# Patient Record
Sex: Male | Born: 1995 | Race: Black or African American | Hispanic: No | Marital: Single | Smoking: Current every day smoker
Health system: Southern US, Community
[De-identification: ages and names within clinical notes are randomized; demographics above are authoritative.]

## PROBLEM LIST (undated history)

## (undated) ENCOUNTER — Ambulatory Visit: Payer: Self-pay | Source: Home / Self Care

---
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
## 2018-06-18 ENCOUNTER — Other Ambulatory Visit: Payer: Self-pay

## 2018-06-18 ENCOUNTER — Encounter (HOSPITAL_COMMUNITY): Payer: Self-pay | Admitting: Emergency Medicine

## 2018-06-18 ENCOUNTER — Emergency Department (HOSPITAL_COMMUNITY)
Admission: EM | Admit: 2018-06-18 | Discharge: 2018-06-18 | Disposition: A | Discharge status: Home / Self Care | Payer: BLUE CROSS/BLUE SHIELD | Attending: Emergency Medicine | Admitting: Emergency Medicine

## 2018-06-18 DIAGNOSIS — M79601 Pain in right arm: Secondary | ICD-10-CM | POA: Insufficient documentation

## 2018-06-18 MED ORDER — METHOCARBAMOL 500 MG PO TABS: 500.0000 mg | ORAL_TABLET | Freq: Two times a day (BID) | ORAL | 0 refills | Status: AC

## 2018-06-18 NOTE — Discharge Instructions (Addendum)
I have prescribed muscle relaxers for your pain, please do not drink or drive while taking this medications as they can make you drowsy.  You may also take aleve over the counter for your pain. Please rest forearm by avoiding  texting or video gamming for the next 5 days.

## 2018-06-18 NOTE — ED Provider Notes (Signed)
[facility]-EMERGENCY DEPT Provider Note   CSN: [id number] Arrival date & time: 06/18/18  1125     History   Chief Complaint Chief Complaint  Patient presents with  . Arm Pain    HPI Mason Brown is a 22 y.o. male.  22 y/o male with no PMH presents to the ED with a chief complaint of right arm pain x 5 days.  Reports he has been playing the game for night several times a day and has been using his hand to play with a remote controls.  He reports mother has tried putting some heat to the area along with taking some Tylenol but states no relief in pain on his muscle.  Scribes the pain is crampy in his forearm but denies any elbow or wrist pain at this time.  It is worse with palpation.  Does currently smoke black in miles but denies any previous history of blood clots.  He denies any shortness of breath, chest pain, trauma.     History reviewed. No pertinent past medical history.  Patient Active Problem List   Diagnosis Date Noted  . PHARYNGITIS 12/24/2009  . GASTROENTERITIS 12/24/2009    History reviewed. No pertinent surgical history.      Home Medications    Prior to Admission medications   Medication Sig Start Date End Date Taking? Authorizing Provider  methocarbamol (ROBAXIN) 500 MG tablet Take 1 tablet (500 mg total) by mouth 2 (two) times daily for 7 days. 06/18/18 06/25/18  Claude Manges, PA-C  naproxen (NAPROSYN) 250 MG tablet Take 1 tablet (250 mg total) by mouth 2 (two) times daily with a meal. 06/20/16   Everlene Farrier, PA-C  oseltamivir (TAMIFLU) 75 MG capsule Take 1 capsule (75 mg total) by mouth every 12 (twelve) hours. 09/11/16   Street, Mullens, PA-C    Family History No family history on file.  Social History Social History   Tobacco Use  . Smoking status: Never Smoker  . Smokeless tobacco: Never Used  Substance Use Topics  . Alcohol use: No  . Drug use: No     Allergies   Patient has no known allergies.   Review  of Systems Review of Systems  Constitutional: Negative for fever.  Musculoskeletal: Positive for myalgias. Negative for arthralgias.     Physical Exam Updated Vital Signs BP 133/83 (BP Location: Left Arm)   Pulse 85   Temp 99.1 F (37.3 C) (Oral)   Resp 20   Ht 5\' 7"  (1.702 m)   Wt 63.5 kg   SpO2 100%   BMI 21.93 kg/m   Physical Exam  Constitutional: He is oriented to person, place, and time. He appears well-developed and well-nourished.  HENT:  Head: Normocephalic and atraumatic.  Mouth/Throat: Oropharynx is clear and moist.  Eyes: Pupils are equal, round, and reactive to light. No scleral icterus.  Neck: Normal range of motion.  Cardiovascular: Normal heart sounds.  Pulmonary/Chest: Effort normal and breath sounds normal. He has no wheezes. He exhibits no tenderness.  Abdominal: Soft. Bowel sounds are normal. He exhibits no distension. There is no tenderness.  Musculoskeletal: He exhibits no deformity.       Right forearm: He exhibits tenderness. He exhibits no swelling, no edema, no deformity and no laceration.       Arms: Neurological: He is alert and oriented to person, place, and time.  Skin: Skin is warm and dry.  Nursing note and vitals reviewed.    ED Treatments / Results  Labs (all labs ordered are listed, but only abnormal results are displayed) Labs Reviewed - No data to display  EKG None  Radiology No results found.  Procedures Procedures (including critical care time)  Medications Ordered in ED Medications - No data to display   Initial Impression / Assessment and Plan / ED Course  I have reviewed the triage vital signs and the nursing notes.  Pertinent labs & imaging results that were available during my care of the patient were reviewed by me and considered in my medical decision making (see chart for details).    Presents with right forearm pain after spending several days playing fortnight.  Mother reports she is tried some heat to the  area but no relieving symptoms.  Upon examination there is no pain on the elbow joint or wrist joint, patient denies any fever.  She has good pulses, will treat this as a musculoskeletal strain due to video gaming.  Patient is to be discharged with muscle relaxers along with rice therapy.  Mother understands and agrees with plan, return precautions provided.  Final Clinical Impressions(s) / ED Diagnoses   Final diagnoses:  Right arm pain    ED Discharge Orders         Ordered    methocarbamol (ROBAXIN) 500 MG tablet  2 times daily     06/18/18 1254           Claude Manges, PA-C 06/18/18 1313    Rolan Bucco, MD 06/18/18 1556

## 2018-06-18 NOTE — ED Notes (Signed)
Bed: WTR8 Expected date: 06/18/18 Expected time: 11:00 AM Means of arrival:  Comments:

## 2018-06-18 NOTE — ED Triage Notes (Signed)
Patient c/o right arm pain x3 days. Denies new injury Reports hx elbow dislocation. Sensation to right arm. Strong radial pulse present.

## 2018-07-09 ENCOUNTER — Emergency Department (HOSPITAL_COMMUNITY)
Admission: EM | Admit: 2018-07-09 | Discharge: 2018-07-09 | Disposition: A | Discharge status: Home / Self Care | Payer: BLUE CROSS/BLUE SHIELD | Attending: Emergency Medicine | Admitting: Emergency Medicine

## 2018-07-09 ENCOUNTER — Emergency Department (HOSPITAL_COMMUNITY): Payer: BLUE CROSS/BLUE SHIELD

## 2018-07-09 ENCOUNTER — Encounter (HOSPITAL_COMMUNITY): Payer: Self-pay | Admitting: Emergency Medicine

## 2018-07-09 DIAGNOSIS — M79631 Pain in right forearm: Secondary | ICD-10-CM | POA: Diagnosis present

## 2018-07-09 DIAGNOSIS — F17290 Nicotine dependence, other tobacco product, uncomplicated: Secondary | ICD-10-CM | POA: Insufficient documentation

## 2018-07-09 MED ORDER — DICLOFENAC SODIUM 50 MG PO TBEC: 50.0000 mg | DELAYED_RELEASE_TABLET | Freq: Two times a day (BID) | ORAL | 0 refills | Status: DC

## 2018-07-09 MED ORDER — IBUPROFEN 200 MG PO TABS
600.0000 mg | ORAL_TABLET | Freq: Once | ORAL | Status: AC
  Administered 2018-07-09: 600 mg via ORAL
  Filled 2018-07-09: qty 3

## 2018-07-09 MED ORDER — METHOCARBAMOL 500 MG PO TABS: 500.0000 mg | ORAL_TABLET | Freq: Two times a day (BID) | ORAL | 0 refills | Status: DC

## 2018-07-09 NOTE — ED Provider Notes (Signed)
[facility]-EMERGENCY DEPT Provider Note   CSN: [id number] Arrival date & time: 07/09/18  1554     History   Chief Complaint Chief Complaint  Patient presents with  . Arm Pain    HPI Mason Brown is a 22 y.o. male right hand dominant who presents to the ED with right forearm pain. Patient was evaluated for the same problem 06/18/18 and treated for muscle spasm that was thought to have occurred due to gaming using remote controls. Patient reports taking the muscle relaxer and less gaming and the pain did improve but 2 days ago it came back worse. Patient denies chest pain shortness of breath, elbow or wrist pain.   HPI  History reviewed. No pertinent past medical history.  Patient Active Problem List   Diagnosis Date Noted  . PHARYNGITIS 12/24/2009  . GASTROENTERITIS 12/24/2009    History reviewed. No pertinent surgical history.      Home Medications    Prior to Admission medications   Medication Sig Start Date End Date Taking? Authorizing Provider  diclofenac (VOLTAREN) 50 MG EC tablet Take 1 tablet (50 mg total) by mouth 2 (two) times daily. 07/09/18   Janne Napoleon, NP  methocarbamol (ROBAXIN) 500 MG tablet Take 1 tablet (500 mg total) by mouth 2 (two) times daily. 07/09/18   Janne Napoleon, NP    Family History No family history on file.  Social History Social History   Tobacco Use  . Smoking status: Current Every Day Smoker    Types: Cigars  . Smokeless tobacco: Never Used  Substance Use Topics  . Alcohol use: No  . Drug use: No     Allergies   Patient has no known allergies.   Review of Systems Review of Systems  Musculoskeletal: Positive for arthralgias.  All other systems reviewed and are negative.    Physical Exam Updated Vital Signs BP 133/61 (BP Location: Right Arm)   Pulse (!) 55   Temp 98.6 F (37 C) (Oral)   Resp 18   SpO2 100%   Physical Exam  Constitutional: He appears well-developed and well-nourished.  No distress.  HENT:  Head: Normocephalic.  Eyes: EOM are normal.  Neck: Neck supple.  Cardiovascular: Normal rate.  Pulmonary/Chest: Effort normal.  Musculoskeletal:       Right forearm: He exhibits tenderness. He exhibits no deformity and no laceration. Swelling: minimal.  Radial pulses 2+, adequate circulation. Grips are equal. Full range of motion wrist and elbow without pain.   Neurological: He is alert.  Skin: Skin is warm and dry.  Psychiatric: He has a normal mood and affect.  Nursing note and vitals reviewed.    ED Treatments / Results  Labs (all labs ordered are listed, but only abnormal results are displayed) Labs Reviewed - No data to display  Radiology Dg Forearm Right  Result Date: 07/09/2018 CLINICAL DATA:  Right forearm pain. EXAM: RIGHT FOREARM - 2 VIEW COMPARISON:  None. FINDINGS: There is no evidence of fracture or other focal bone lesions. Soft tissues are unremarkable. IMPRESSION: Negative. Electronically Signed   By: Lupita Raider, M.D.   On: 07/09/2018 17:34    Procedures Procedures (including critical care time)  Medications Ordered in ED Medications  ibuprofen (ADVIL,MOTRIN) tablet 600 mg (600 mg Oral Given 07/09/18 1805)     Initial Impression / Assessment and Plan / ED Course  I have reviewed the triage vital signs and the nursing notes. 22 y.o. male with right forearm pain  that has been off and on since his last visit to the ED. Patient stable for d/c without focal neuro deficits. Ace wrap, rest the area, muscle relaxer and NSAIDS. Patient to f/u if symptoms worsen.   Final Clinical Impressions(s) / ED Diagnoses   Final diagnoses:  Right forearm pain    ED Discharge Orders         Ordered    diclofenac (VOLTAREN) 50 MG EC tablet  2 times daily     07/09/18 1740    methocarbamol (ROBAXIN) 500 MG tablet  2 times daily     07/09/18 1740           Damian Leavelleese, NicasioHope M, NP 07/09/18 2148    Charlynne PanderYao, David Hsienta, MD 07/09/18 2328

## 2018-07-09 NOTE — Discharge Instructions (Signed)
Do not drive or work while taking the muscle relaxer as it will make you sleepy. Follow up with Dr. Amanda PeaGramig if symptoms persist.

## 2018-07-09 NOTE — ED Triage Notes (Signed)
Pt c/o right forearm pain. Was seen here about 3 weeks ago and took muscle relaxer's but still having pains esp when moving wrist around.

## 2019-05-10 IMAGING — CR DG FOREARM 2V*R*
2 series · 2 of 2 positions shown · non-contrast
Comparison: None.

CLINICAL DATA: Right forearm pain.

EXAM:
RIGHT FOREARM - 2 VIEW

[x forearm ap right]
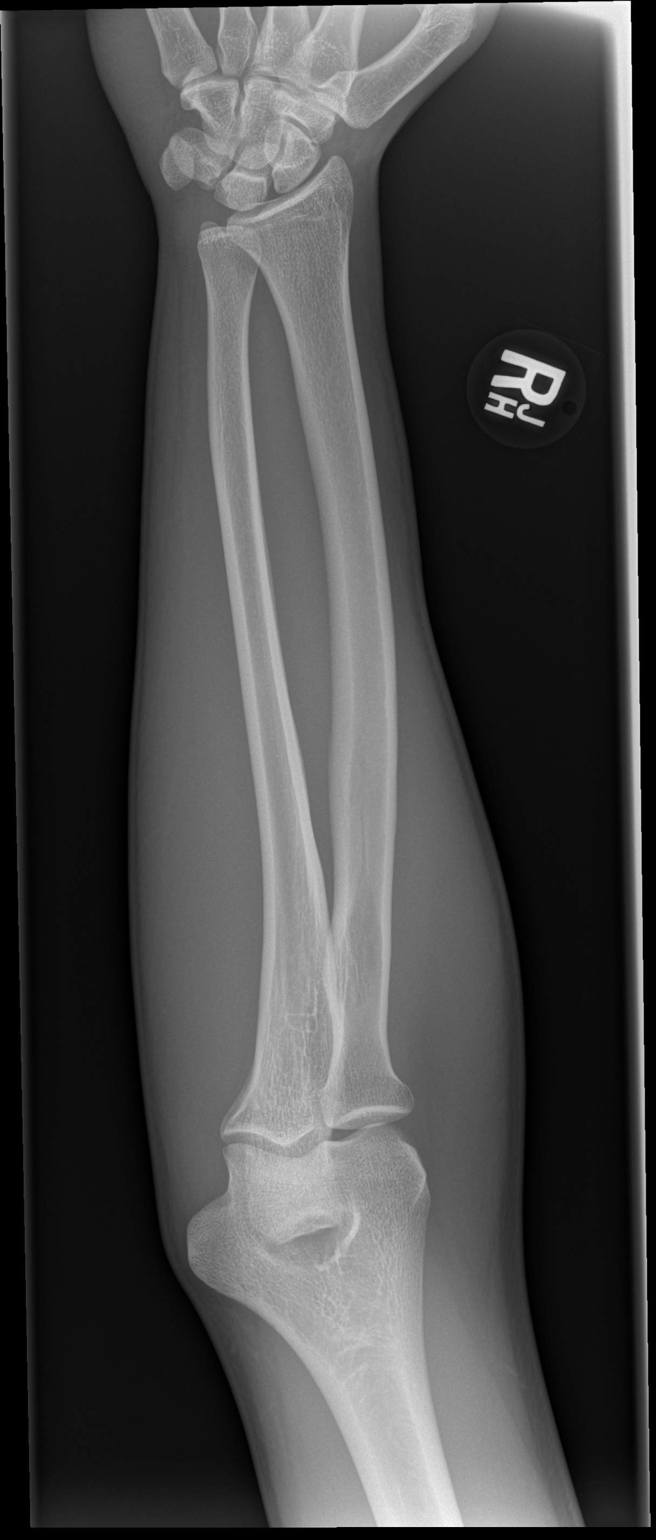

[x forearm lat right]
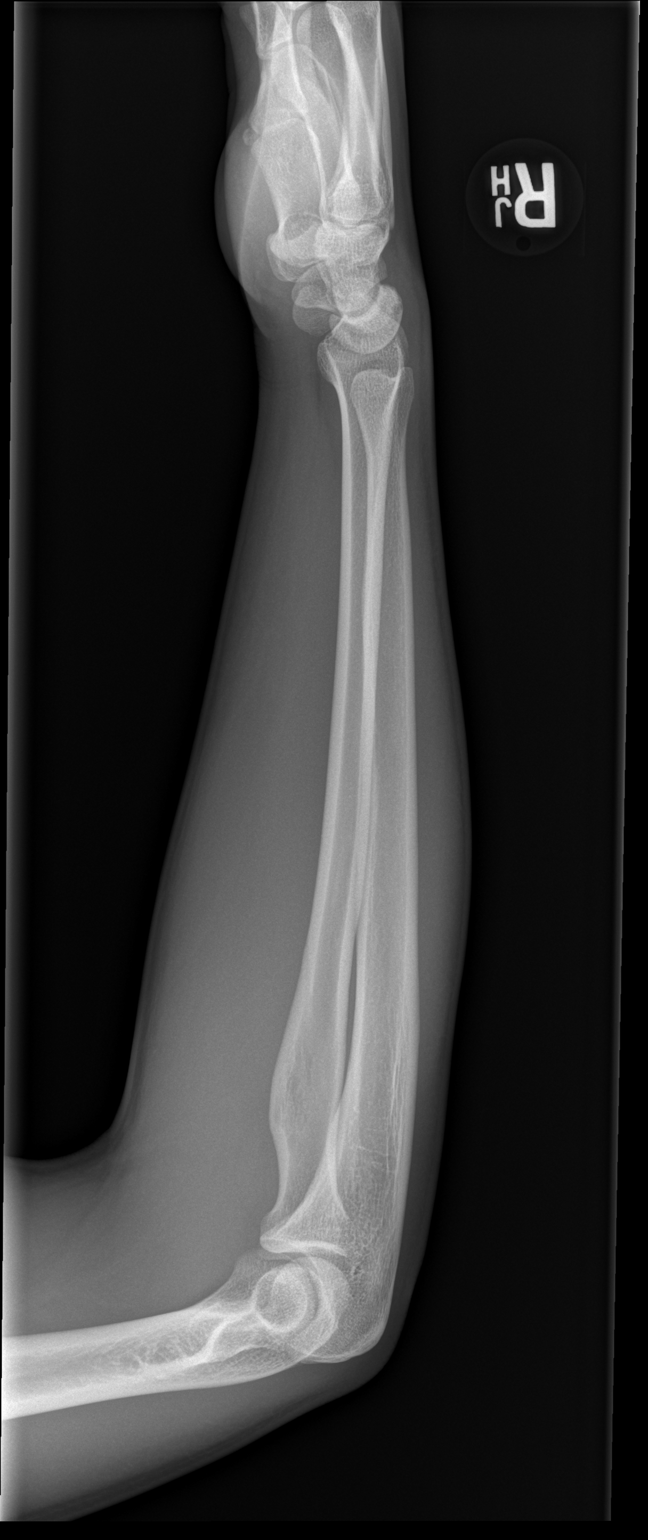

[2 of 2 positions shown; findings below may reference images not displayed]

FINDINGS: There is no evidence of fracture or other focal bone lesions. Soft
tissues are unremarkable.
IMPRESSION: Negative.

## 2019-09-11 ENCOUNTER — Ambulatory Visit: Payer: Self-pay | Attending: Internal Medicine

## 2019-09-11 DIAGNOSIS — U071 COVID-19: Secondary | ICD-10-CM | POA: Insufficient documentation

## 2019-09-11 DIAGNOSIS — Z20822 Contact with and (suspected) exposure to covid-19: Secondary | ICD-10-CM

## 2019-09-12 LAB — NOVEL CORONAVIRUS, NAA: SARS-CoV-2, NAA: DETECTED — AB

## 2019-09-13 ENCOUNTER — Ambulatory Visit (INDEPENDENT_AMBULATORY_CARE_PROVIDER_SITE_OTHER)
Admission: RE | Admit: 2019-09-13 | Discharge: 2019-09-13 | Disposition: A | Discharge status: Home / Self Care | Payer: Self-pay | Source: Ambulatory Visit

## 2019-09-13 DIAGNOSIS — F17290 Nicotine dependence, other tobacco product, uncomplicated: Secondary | ICD-10-CM

## 2019-09-13 DIAGNOSIS — U071 COVID-19: Secondary | ICD-10-CM

## 2019-09-13 DIAGNOSIS — R062 Wheezing: Secondary | ICD-10-CM

## 2019-09-13 MED ORDER — ONDANSETRON HCL 4 MG PO TABS: 4.0000 mg | ORAL_TABLET | Freq: Three times a day (TID) | ORAL | 0 refills | Status: DC | PRN

## 2019-09-13 MED ORDER — ALBUTEROL SULFATE HFA 108 (90 BASE) MCG/ACT IN AERS: 1.0000 | INHALATION_SPRAY | RESPIRATORY_TRACT | 0 refills | Status: DC | PRN

## 2019-09-13 NOTE — ED Provider Notes (Signed)
Virtual Visit via Video Note:  Mason Brown  initiated request for Telemedicine visit with [facility] team. I connected with Mason Brown  on 09/13/2019 at 2:57 PM  for a synchronized telemedicine visit using a video enabled HIPPA compliant telemedicine application. I verified that I am speaking with Mason Brown  using two identifiers. Georgetta Haber, NP  was physically located in a [facility] Urgent care site and Mason Brown was located at a different location.   The limitations of evaluation and management by telemedicine as well as the availability of in-person appointments were discussed. Patient was informed that he  may incur a bill ( including co-pay) for this virtual visit encounter. [name]  expressed understanding and gave verbal consent to proceed with virtual visit.     History of Present Illness:Mason Brown  is a 24 y.o. male presents with complaints of shortness of breath and chest tightness. Woke this morning and felt severe symptoms, his sister called EMS and he was given a breathing treatment. Symptoms resolved. He has covid-19, symptoms started yesterday. Family members in his home also with covid. Now his breathing feels much better, easy and with minimal chest tightness. Still with dry cough. No chest pain . No asthma history. Does smoke 3 black and milds a day and smokes marijuana daily. No fevers. Has had some nausea and vomiting.    History reviewed. No pertinent past medical history.  No Known Allergies      Observations/Objective: Alert, oriented, non toxic in appearance. Clear coherent speech without difficulty. No increased work of breathing visualized.  No cough throughout discussion. No tachypnea noted.  Assessment and Plan: Patient with symptom resolution with use of nebulizer this morning. Known covid. Smoker. No asthma history. Was encouraged by ems to get an inhaler, which is provided today. zofran prn as well. Strict in person  precautions provided. Patient verbalized understanding and agreeable to plan.    Follow Up Instructions:    I discussed the assessment and treatment plan with the patient. The patient was provided an opportunity to ask questions and all were answered. The patient agreed with the plan and demonstrated an understanding of the instructions.   The patient was advised to call back or seek an in-person evaluation if the symptoms worsen or if the condition fails to improve as anticipated.  I provided 15 minutes of non-face-to-face time during this encounter.    Georgetta Haber, NP  09/13/2019 2:57 PM          Georgetta Haber, NP 09/13/19 1459

## 2019-09-13 NOTE — Discharge Instructions (Addendum)
Use of inhaler as needed every 4-6 hours for wheezing or chest tightness.  Zofran every 8 hours as needed for nausea or vomiting.   You may try some vitamins to help your immune system potentially:  Vitamin C 500mg  twice a day. Zinc 50mg  daily. Vitamin D 5000IU daily.   If you develop shortness of breath , difficulty breathing, chest pain, your inhaler is not helping, please be seen in person.  Please try to decrease to quit smoking as this can exacerbate and lengthen course of illness.

## 2019-09-29 ENCOUNTER — Telehealth: Payer: Self-pay | Admitting: Physician Assistant

## 2019-09-29 DIAGNOSIS — U071 COVID-19: Secondary | ICD-10-CM

## 2019-09-29 NOTE — Progress Notes (Signed)
We are sorry you are not feeling well. We are here to help!  You have tested positive for COVID-19, meaning that you were infected with the novel coronavirus and could give the germ to others.  Because you tested positive, you should have gone 14 days from that test which is February 18 accordingly to the date she was given. So it is okay for you to return to work now.  You have been enrolled in [facility] for COVID-19. Daily you will receive a questionnaire within the [facility] website. Our COVID-19 response team will be monitoring your responses daily.  Please continue isolation at home, for at least 10 days since the start of your symptoms and until you have had 24 hours with no fever (without taking a fever reducer) and with improving of symptoms. Please continue good preventive care measures, including: frequent hand-washing, avoid touching your face, cover coughs/sneezes, stay out of crowds and keep a 6 foot distance from others. Follow up with your provider or go to the nearest hospital ED for re-assessment if fever/cough/breathlessness return.  The following symptoms may appear 2-14 days after exposure:  Fever  Cough  Shortness of breath or difficulty breathing  Chills  Repeated shaking with chills  Muscle pain  Headache  Sore throat  New loss of taste or smell  Fatigue  Congestion or runny nose  Nausea or vomiting  Diarrhea Go to the nearest hospital ED for assessment if fever/cough/breathlessness are severe or illness seems like a threat to life. It is vitally important that if you feel that you have an infection such as this virus or any other virus that you stay home and away from places where you may spread it to others. You should avoid contact with people age 45 and older.  You may also take acetaminophen (Tylenol) as needed for fever.  Reduce your risk of any infection by using the same precautions used for avoiding the common cold or flu:  Wash your hands often with  soap and warm water for at least 20 seconds. If soap and water are not readily available, use an alcohol-based hand sanitizer with at least 60% alcohol.  If coughing or sneezing, cover your mouth and nose by coughing or sneezing into the elbow areas of your shirt or coat, into a tissue or into your sleeve (not your hands).  Avoid shaking hands with others and consider head nods or verbal greetings only.  Avoid touching your eyes, nose, or mouth with unwashed hands.  Avoid close contact with people who are sick.  Avoid places or events with large numbers of people in one location, like concerts or sporting events.  Carefully consider travel plans you have or are making.  If you are planning any travel outside or inside the [place], visit the CDC's Travelers' Health webpage for the latest health notices. If you have some symptoms but not all symptoms, continue to monitor at home and seek medical attention if your symptoms worsen.  If you are having a medical emergency, call 911. HOME CARE  Only take medications as instructed by your medical team.  Drink plenty of fluids and get plenty of rest.  A steam or ultrasonic humidifier can help if you have congestion.  GET HELP RIGHT AWAY IF YOU HAVE EMERGENCY WARNING SIGNS** FOR COVID-19. If you or someone is showing any of these signs seek emergency medical care immediately. Call 911 or proceed to your closest emergency facility if:  You develop worsening high fever.  Trouble breathing  Bluish lips or face  Persistent pain or pressure in the chest  New confusion  Inability to wake or stay awake  You cough up blood.  Your symptoms become more severe **This list is not all possible symptoms. Contact your medical provider for any symptoms that are sever or concerning to you.  MAKE SURE YOU  Understand these instructions.  Will watch your condition.  Will get help right away if you are not doing well or get worse. Your e-visit answers were reviewed by a  board certified advanced clinical practitioner to complete your personal care plan. Depending on the condition, your plan could have included both over the counter or prescription medications. If there is a problem please reply once you have received a response from your provider.  Your safety is important to [place]. If you have drug allergies check your prescription carefully.  You can use MyChart to ask questions about today's visit, request a non-urgent call back, or ask for a work or school excuse for 24 hours related to this e-Visit. If it has been greater than 24 hours you will need to follow up with your provider, or enter a new e-Visit to address those concerns.  You will get an e-mail in the next two days asking about your experience. I hope that your e-visit has been valuable and will speed your recovery. Thank you for using e-visits.   Prudy Feeler PA-C

## 2019-09-29 NOTE — Progress Notes (Signed)
We are sorry you are not feeling well. We are here to help!  You have tested positive for COVID-19, meaning that you were infected with the novel coronavirus and could give the germ to others.   Because you tested positive, you should have gone 14 days from that test which is February 18 accordingly to the date she was given.  So it is okay for you to return to work now.     You have been enrolled in [facility] for COVID-19. Daily you will receive a questionnaire within the [facility] website. Our COVID-19 response team will be monitoring your responses daily.  Please continue isolation at home, for at least 10 days since the start of your symptoms and until you have had 24 hours with no fever (without taking a fever reducer) and with improving of symptoms.  Please continue good preventive care measures, including:  frequent hand-washing, avoid touching your face, cover coughs/sneezes, stay out of crowds and keep a 6 foot distance from others.  Follow up with your provider or go to the nearest hospital ED for re-assessment if fever/cough/breathlessness return.  The following symptoms may appear 2-14 days after exposure: . Fever . Cough . Shortness of breath or difficulty breathing . Chills . Repeated shaking with chills . Muscle pain . Headache . Sore throat . New loss of taste or smell . Fatigue . Congestion or runny nose . Nausea or vomiting . Diarrhea  Go to the nearest hospital ED for assessment if fever/cough/breathlessness are severe or illness seems like a threat to life.  It is vitally important that if you feel that you have an infection such as this virus or any other virus that you stay home and away from places where you may spread it to others.  You should avoid contact with people age 30 and older.     You may also take acetaminophen (Tylenol) as needed for fever.  Reduce your risk of any infection by using the same precautions used for avoiding the common cold  or flu:  [place] Kitchen Wash your hands often with soap and warm water for at least 20 seconds.  If soap and water are not readily available, use an alcohol-based hand sanitizer with at least 60% alcohol.  . If coughing or sneezing, cover your mouth and nose by coughing or sneezing into the elbow areas of your shirt or coat, into a tissue or into your sleeve (not your hands). . Avoid shaking hands with others and consider head nods or verbal greetings only. . Avoid touching your eyes, nose, or mouth with unwashed hands.  . Avoid close contact with people who are sick. . Avoid places or events with large numbers of people in one location, like concerts or sporting events. . Carefully consider travel plans you have or are making. . If you are planning any travel outside or inside the [place], visit the CDC's Travelers' Health webpage for the latest health notices. . If you have some symptoms but not all symptoms, continue to monitor at home and seek medical attention if your symptoms worsen. . If you are having a medical emergency, call 911.  HOME CARE . Only take medications as instructed by your medical team. . Drink plenty of fluids and get plenty of rest. . A steam or ultrasonic humidifier can help if you have congestion.   GET HELP RIGHT AWAY IF YOU HAVE EMERGENCY WARNING SIGNS** FOR COVID-19. If you or someone is showing any of these signs seek  emergency medical care immediately. Call 911 or proceed to your closest emergency facility if: . You develop worsening high fever. . Trouble breathing . Bluish lips or face . Persistent pain or pressure in the chest . New confusion . Inability to wake or stay awake . You cough up blood. . Your symptoms become more severe  **This list is not all possible symptoms. Contact your medical provider for any symptoms that are sever or concerning to you.  MAKE SURE YOU   Understand these instructions.  Will watch your condition.  Will get help right away if you  are not doing well or get worse.  Your e-visit answers were reviewed by a board certified advanced clinical practitioner to complete your personal care plan.  Depending on the condition, your plan could have included both over the counter or prescription medications.  If there is a problem please reply once you have received a response from your provider.  Your safety is important to [place].  If you have drug allergies check your prescription carefully.    You can use MyChart to ask questions about today's visit, request a non-urgent call back, or ask for a work or school excuse for 24 hours related to this e-Visit. If it has been greater than 24 hours you will need to follow up with your provider, or enter a new e-Visit to address those concerns. You will get an e-mail in the next two days asking about your experience.  I hope that your e-visit has been valuable and will speed your recovery. Thank you for using e-visits.   Prudy Feeler PA-C  Approximately 5 minutes was spent documenting and reviewing patient's chart.

## 2021-01-07 ENCOUNTER — Other Ambulatory Visit: Payer: Self-pay

## 2021-01-07 ENCOUNTER — Ambulatory Visit
Admission: EM | Admit: 2021-01-07 | Discharge: 2021-01-07 | Disposition: A | Discharge status: Home / Self Care | Payer: Self-pay | Attending: Student | Admitting: Student

## 2021-01-07 DIAGNOSIS — N489 Disorder of penis, unspecified: Secondary | ICD-10-CM | POA: Insufficient documentation

## 2021-01-07 DIAGNOSIS — Z113 Encounter for screening for infections with a predominantly sexual mode of transmission: Secondary | ICD-10-CM | POA: Insufficient documentation

## 2021-01-07 MED ORDER — CEFTRIAXONE SODIUM 500 MG IJ SOLR
500.0000 mg | Freq: Once | INTRAMUSCULAR | Status: AC
  Administered 2021-01-07: 500 mg via INTRAMUSCULAR

## 2021-01-07 NOTE — ED Provider Notes (Signed)
[facility]    CSN: [id number] Arrival date & time: 01/07/21  1728      History   Chief Complaint Chief Complaint  Patient presents with  . Penis Pain    HPI Mason Brown is a 25 y.o. male presenting with penis pain. Medical history noncontributory. Pt went to the beach 3 weeks ago and notes when he showered that night he noticed what appeared to be a scab on the top of his penis near the opening. One week later the lesion became  "very painful" an appeared to look like an "open wound" with a new onset of left sided groin pain. Three days ago, the area worsened, increased in size and transitioned from a red to white color with scabbing. Has been using A&D ointment noting improvement initially, but has since stopped decreasing effectiveness. Denies itching. No n/v/d. No swelling or discharge. No dysuria. Adamant that he is with the same male partner and denies STI risk.   HPI  History reviewed. No pertinent past medical history.  Patient Active Problem List   Diagnosis Date Noted  . PHARYNGITIS 12/24/2009  . GASTROENTERITIS 12/24/2009    History reviewed. No pertinent surgical history.     Home Medications    Prior to Admission medications   Medication Sig Start Date End Date Taking? Authorizing Provider  albuterol (VENTOLIN HFA) 108 (90 Base) MCG/ACT inhaler Inhale 1-2 puffs into the lungs every 4 (four) hours as needed for wheezing or shortness of breath. 09/13/19   Georgetta Haber, NP  diclofenac (VOLTAREN) 50 MG EC tablet Take 1 tablet (50 mg total) by mouth 2 (two) times daily. 07/09/18   Janne Napoleon, NP  methocarbamol (ROBAXIN) 500 MG tablet Take 1 tablet (500 mg total) by mouth 2 (two) times daily. 07/09/18   Janne Napoleon, NP  ondansetron (ZOFRAN) 4 MG tablet Take 1 tablet (4 mg total) by mouth every 8 (eight) hours as needed for nausea or vomiting. 09/13/19   Georgetta Haber, NP    Family History History reviewed. No pertinent family history.  Social  History Social History   Tobacco Use  . Smoking status: Current Every Day Smoker    Types: Cigars  . Smokeless tobacco: Never Used  Vaping Use  . Vaping Use: Former  Substance Use Topics  . Alcohol use: Yes    Comment: occasionally  . Drug use: Yes    Types: Marijuana    Comment: QD     Allergies   Patient has no known allergies.   Review of Systems Review of Systems  Constitutional: Negative for appetite change, chills, diaphoresis and fever.  Respiratory: Negative for shortness of breath.   Cardiovascular: Negative for chest pain.  Gastrointestinal: Negative for abdominal pain, blood in stool, constipation, diarrhea, nausea and vomiting.  Genitourinary: Negative for decreased urine volume, difficulty urinating, dysuria, flank pain, frequency, genital sores, hematuria, penile discharge, penile pain, penile swelling, scrotal swelling, testicular pain and urgency.       Penile lesion  Musculoskeletal: Negative for back pain.  Neurological: Negative for dizziness, weakness and light-headedness.  All other systems reviewed and are negative.    Physical Exam Triage Vital Signs ED Triage Vitals  Enc Vitals Group     BP 01/07/21 1856 118/76     Pulse Rate 01/07/21 1856 72     Resp 01/07/21 1856 18     Temp 01/07/21 1856 98.9 F (37.2 C)     Temp Source 01/07/21 1856 Oral  SpO2 01/07/21 1856 97 %     Weight --      Height --      Head Circumference --      Peak Flow --      Pain Score 01/07/21 1904 5     Pain Loc --      Pain Edu? --      Excl. in GC? --    No data found.  Updated Vital Signs BP 118/76 (BP Location: Right Arm)   Pulse 72   Temp 98.9 F (37.2 C) (Oral)   Resp 18   SpO2 97%   Visual Acuity Right Eye Distance:   Left Eye Distance:   Bilateral Distance:    Right Eye Near:   Left Eye Near:    Bilateral Near:     Physical Exam Vitals reviewed. Exam conducted with a chaperone present.  Constitutional:      General: He is not in acute  distress.    Appearance: Normal appearance. He is not ill-appearing.  HENT:     Head: Normocephalic and atraumatic.     Mouth/Throat:     Mouth: Mucous membranes are moist.     Comments: Moist mucous membranes Eyes:     Extraocular Movements: Extraocular movements intact.     Pupils: Pupils are equal, round, and reactive to light.  Cardiovascular:     Rate and Rhythm: Normal rate and regular rhythm.     Heart sounds: Normal heart sounds.  Pulmonary:     Effort: Pulmonary effort is normal.     Breath sounds: Normal breath sounds. No wheezing, rhonchi or rales.  Abdominal:     General: Bowel sounds are normal. There is no distension.     Palpations: Abdomen is soft. There is no mass.     Tenderness: There is no abdominal tenderness. There is no right CVA tenderness, left CVA tenderness, guarding or rebound.  Genitourinary:    Comments: Chaperone - CNA Shay  there is a 0.5 cm penile lesion on the penile head, mildly tender to palpation. Scabbed over. No surrounding erythema. No discharge. L inguinal lymphadenopathy.   Skin:    General: Skin is warm.     Capillary Refill: Capillary refill takes less than 2 seconds.     Comments: Good skin turgor  Neurological:     General: No focal deficit present.     Mental Status: He is alert and oriented to person, place, and time.  Psychiatric:        Mood and Affect: Mood normal.        Behavior: Behavior normal.      UC Treatments / Results  Labs (all labs ordered are listed, but only abnormal results are displayed) Labs Reviewed  HSV CULTURE AND TYPING  HIV ANTIBODY (ROUTINE TESTING W REFLEX)  RPR  CYTOLOGY, (ORAL, ANAL, URETHRAL) ANCILLARY ONLY    EKG   Radiology No results found.  Procedures Procedures (including critical care time)  Medications Ordered in UC Medications  cefTRIAXone (ROCEPHIN) injection 500 mg (500 mg Intramuscular Given 01/07/21 1946)    Initial Impression / Assessment and Plan / UC Course  I  have reviewed the triage vital signs and the nursing notes.  Pertinent labs & imaging results that were available during my care of the patient were reviewed by me and considered in my medical decision making (see chart for details).     This patient is a 25 year old male presenting with penile lesion.  He is afebrile, nontachycardic nontachypneic.  No abdominal pain or CVAT.  On exam, there is a 0.5 cm penile lesion on the penile head, mildly tender to palpation.  No discharge, erythema.  DDx is chancroid versus chancre versus LGV vs HSV. Rocephin administered today. Will test for gonorrhea, chlamydia, trichomonas, HIV, syphilis, HSV. Safe sex precautions.  Coding this visit a level 4 for new condition with uncertain prognosis (penile lesion), and prescription drug management.  Final Clinical Impressions(s) / UC Diagnoses   Final diagnoses:  Routine screening for STI (sexually transmitted infection)  Penile lesion     Discharge Instructions     -We are screening you for gonorrhea, chlamydia, trichomonas, HIV, syphilis, genital herpes. -We treated you with an antibiotic today to cover for some of these illnesses. -We will call you and send over additional medication if we need to pending test results. -Seek additional medical attention if your symptoms worsen despite treatment, like worsening of the lesion, new fever/chills, worsening of the groin pain, new abdominal pain, new back pain. -Abstain from intercourse until your lesion is healed and we have diagnosed your condition.    ED Prescriptions    None     PDMP not reviewed this encounter.   Rhys Martini, PA-C 01/08/21 1020

## 2021-01-07 NOTE — ED Triage Notes (Signed)
Pt went to the beach 3 weeks ago and notes when he showered that night he noticed what appeared to be a scab on the top of his penis near the opening. One week later the lesion became  "very painful" an appeared to look like an "open wound" with a new onset of left sided groin pain. Three days ago, the area worsened, increased in size and transitioned from a red to white color.  Has been using A&D ointment noting improvement initially, but has since stopped decreasing effectiveness. Denies itching. No n/v/d. No swelling or discharge. No dysuria.

## 2021-01-07 NOTE — Discharge Instructions (Addendum)
-  We are screening you for gonorrhea, chlamydia, trichomonas, HIV, syphilis, genital herpes. -We treated you with an antibiotic today to cover for some of these illnesses. -We will call you and send over additional medication if we need to pending test results. -Seek additional medical attention if your symptoms worsen despite treatment, like worsening of the lesion, new fever/chills, worsening of the groin pain, new abdominal pain, new back pain. -Abstain from intercourse until your lesion is healed and we have diagnosed your condition.

## 2021-01-10 LAB — CYTOLOGY, (ORAL, ANAL, URETHRAL) ANCILLARY ONLY
Chlamydia: NEGATIVE
Comment: NEGATIVE
Comment: NEGATIVE
Comment: NORMAL
Neisseria Gonorrhea: NEGATIVE
Trichomonas: NEGATIVE

## 2021-01-11 ENCOUNTER — Ambulatory Visit (HOSPITAL_COMMUNITY)
Admission: EM | Admit: 2021-01-11 | Discharge: 2021-01-11 | Disposition: A | Discharge status: Home / Self Care | Payer: Self-pay | Attending: Emergency Medicine | Admitting: Emergency Medicine

## 2021-01-11 ENCOUNTER — Ambulatory Visit
Admission: EM | Admit: 2021-01-11 | Discharge: 2021-01-11 | Disposition: A | Discharge status: Home / Self Care | Payer: Self-pay | Attending: Emergency Medicine | Admitting: Emergency Medicine

## 2021-01-11 ENCOUNTER — Other Ambulatory Visit: Payer: Self-pay

## 2021-01-11 DIAGNOSIS — A539 Syphilis, unspecified: Secondary | ICD-10-CM

## 2021-01-11 LAB — HIV ANTIBODY (ROUTINE TESTING W REFLEX): HIV Screen 4th Generation wRfx: NONREACTIVE

## 2021-01-11 LAB — RPR, QUANT+TP ABS (REFLEX)
Rapid Plasma Reagin, Quant: 1:32 {titer} — ABNORMAL HIGH
T Pallidum Abs: REACTIVE — AB

## 2021-01-11 LAB — RPR: RPR Ser Ql: REACTIVE — AB

## 2021-01-11 MED ORDER — PENICILLIN G BENZATHINE 1200000 UNIT/2ML IM SUSY
PREFILLED_SYRINGE | INTRAMUSCULAR | Status: AC
  Filled 2021-01-11: qty 4

## 2021-01-11 MED ORDER — PENICILLIN G BENZATHINE 1200000 UNIT/2ML IM SUSY
2.4000 10*6.[IU] | PREFILLED_SYRINGE | Freq: Once | INTRAMUSCULAR | Status: AC
  Administered 2021-01-11: 2.4 10*6.[IU] via INTRAMUSCULAR

## 2021-01-11 MED ORDER — PENICILLIN G BENZATHINE 1200000 UNIT/2ML IM SUSY: 2.4000 10*6.[IU] | PREFILLED_SYRINGE | Freq: Once | INTRAMUSCULAR | Status: DC

## 2021-01-11 NOTE — ED Triage Notes (Signed)
Patient presents to UC for follow-up on blood work. Per provider pt to be treated for abnormal labs medication not available. Instructed pt to go to [facility] for treatment. Verbalized understanding.

## 2021-01-11 NOTE — ED Triage Notes (Signed)
Pt is here today for STD treatment. Pt denies any concerns.

## 2021-01-12 LAB — HSV CULTURE AND TYPING

## 2021-01-20 ENCOUNTER — Ambulatory Visit
Admission: EM | Admit: 2021-01-20 | Discharge: 2021-01-20 | Disposition: A | Discharge status: Home / Self Care | Payer: Self-pay | Attending: Emergency Medicine | Admitting: Emergency Medicine

## 2021-01-20 ENCOUNTER — Encounter: Payer: Self-pay | Admitting: Emergency Medicine

## 2021-01-20 ENCOUNTER — Other Ambulatory Visit: Payer: Self-pay

## 2021-01-20 DIAGNOSIS — B9789 Other viral agents as the cause of diseases classified elsewhere: Secondary | ICD-10-CM

## 2021-01-20 DIAGNOSIS — J028 Acute pharyngitis due to other specified organisms: Secondary | ICD-10-CM

## 2021-01-20 DIAGNOSIS — Z20822 Contact with and (suspected) exposure to covid-19: Secondary | ICD-10-CM

## 2021-01-20 MED ORDER — IBUPROFEN 800 MG PO TABS: 800.0000 mg | ORAL_TABLET | Freq: Three times a day (TID) | ORAL | 0 refills | Status: DC

## 2021-01-20 MED ORDER — CETIRIZINE HCL 10 MG PO CAPS: 10.0000 mg | ORAL_CAPSULE | Freq: Every day | ORAL | 0 refills | Status: DC

## 2021-01-20 NOTE — ED Triage Notes (Signed)
Pt here for covid exposure on Monday; pt now sts sore throat

## 2021-01-20 NOTE — Discharge Instructions (Addendum)
COVID test pending, monitor MyChart for results Tylenol and ibuprofen for any fevers, headaches, body aches, sore throat Rest and fluids Daily cetirizine help with postnasal drainage/throat irritation May use other over-the-counter medicine as needed for cough and congestion Follow-up for any concerns

## 2021-01-20 NOTE — ED Provider Notes (Signed)
[facility]    CSN: [id number] Arrival date & time: 01/20/21  1444      History   Chief Complaint Chief Complaint  Patient presents with   Covid Exposure    HPI Mason Brown is a 25 y.o. male presenting today for evaluation of sore throat and COVID exposure.  Reports that he was exposed to his brother who was positive for COVID on Monday/Tuesday.  Reports over the past 24 hours has developed sore throat and slight postnasal drainage.  Denies any fevers chills or body aches.  Denies congestion or cough.  Denies GI symptoms.  HPI  History reviewed. No pertinent past medical history.  Patient Active Problem List   Diagnosis Date Noted   PHARYNGITIS 12/24/2009   GASTROENTERITIS 12/24/2009    History reviewed. No pertinent surgical history.     Home Medications    Prior to Admission medications   Medication Sig Start Date End Date Taking? Authorizing Provider  Cetirizine HCl 10 MG CAPS Take 1 capsule (10 mg total) by mouth daily for 10 days. 01/20/21 01/30/21 Yes Vergia Chea C, PA-C  ibuprofen (ADVIL) 800 MG tablet Take 1 tablet (800 mg total) by mouth 3 (three) times daily. 01/20/21  Yes Tasheika Kitzmiller C, PA-C  albuterol (VENTOLIN HFA) 108 (90 Base) MCG/ACT inhaler Inhale 1-2 puffs into the lungs every 4 (four) hours as needed for wheezing or shortness of breath. 09/13/19   Georgetta Haber, NP  ondansetron (ZOFRAN) 4 MG tablet Take 1 tablet (4 mg total) by mouth every 8 (eight) hours as needed for nausea or vomiting. 09/13/19   Georgetta Haber, NP    Family History History reviewed. No pertinent family history.  Social History Social History   Tobacco Use   Smoking status: Every Day    Pack years: 0.00    Types: Cigars   Smokeless tobacco: Never  Vaping Use   Vaping Use: Former  Substance Use Topics   Alcohol use: Yes    Comment: occasionally   Drug use: Yes    Types: Marijuana    Comment: QD     Allergies   Patient has no known  allergies.   Review of Systems Review of Systems  Constitutional:  Negative for activity change, appetite change, chills, fatigue and fever.  HENT:  Positive for postnasal drip and sore throat. Negative for congestion, ear pain, rhinorrhea, sinus pressure and trouble swallowing.   Eyes:  Negative for discharge and redness.  Respiratory:  Negative for cough, chest tightness and shortness of breath.   Cardiovascular:  Negative for chest pain.  Gastrointestinal:  Negative for abdominal pain, diarrhea, nausea and vomiting.  Musculoskeletal:  Negative for myalgias.  Skin:  Negative for rash.  Neurological:  Negative for dizziness, light-headedness and headaches.    Physical Exam Triage Vital Signs ED Triage Vitals  Enc Vitals Group     BP 01/20/21 1500 126/84     Pulse Rate 01/20/21 1500 95     Resp 01/20/21 1500 18     Temp 01/20/21 1500 99 F (37.2 C)     Temp Source 01/20/21 1500 Oral     SpO2 01/20/21 1500 96 %     Weight --      Height --      Head Circumference --      Peak Flow --      Pain Score 01/20/21 1501 3     Pain Loc --      Pain Edu? --  Excl. in GC? --    No data found.  Updated Vital Signs BP 126/84 (BP Location: Left Arm)   Pulse 95   Temp 99 F (37.2 C) (Oral)   Resp 18   SpO2 96%   Visual Acuity Right Eye Distance:   Left Eye Distance:   Bilateral Distance:    Right Eye Near:   Left Eye Near:    Bilateral Near:     Physical Exam Vitals and nursing note reviewed.  Constitutional:      Appearance: He is well-developed.     Comments: No acute distress  HENT:     Head: Normocephalic and atraumatic.     Ears:     Comments: Bilateral ears without tenderness to palpation of external auricle, tragus and mastoid, EAC's without erythema or swelling, TM's with good bony landmarks and cone of light. Non erythematous.      Nose: Nose normal.     Mouth/Throat:     Comments: Oral mucosa pink and moist, no tonsillar enlargement or exudate.  Posterior pharynx patent and nonerythematous, no uvula deviation or swelling. Normal phonation.  Eyes:     Conjunctiva/sclera: Conjunctivae normal.  Cardiovascular:     Rate and Rhythm: Normal rate.  Pulmonary:     Effort: Pulmonary effort is normal. No respiratory distress.     Comments: Breathing comfortably at rest, CTABL, no wheezing, rales or other adventitious sounds auscultated  Abdominal:     General: There is no distension.  Musculoskeletal:        General: Normal range of motion.     Cervical back: Neck supple.  Skin:    General: Skin is warm and dry.  Neurological:     Mental Status: He is alert and oriented to person, place, and time.     UC Treatments / Results  Labs (all labs ordered are listed, but only abnormal results are displayed) Labs Reviewed  NOVEL CORONAVIRUS, NAA    EKG   Radiology No results found.  Procedures Procedures (including critical care time)  Medications Ordered in UC Medications - No data to display  Initial Impression / Assessment and Plan / UC Course  I have reviewed the triage vital signs and the nursing notes.  Pertinent labs & imaging results that were available during my care of the patient were reviewed by me and considered in my medical decision making (see chart for details).     Sore throat-suspect likely viral etiology, exam not consistent with pharyngitis/strep or tonsillitis, recent COVID exposure, increased likelihood of this.  Exam reassuring, recommend symptomatic and supportive care.  Discussed recommendations.  Discussed strict return precautions. Patient verbalized understanding and is agreeable with plan.  Final Clinical Impressions(s) / UC Diagnoses   Final diagnoses:  Encounter for screening laboratory testing for COVID-19 virus  Sore throat (viral)     Discharge Instructions      COVID test pending, monitor MyChart for results Tylenol and ibuprofen for any fevers, headaches, body aches, sore  throat Rest and fluids Daily cetirizine help with postnasal drainage/throat irritation May use other over-the-counter medicine as needed for cough and congestion Follow-up for any concerns     ED Prescriptions     Medication Sig Dispense Auth. Provider   ibuprofen (ADVIL) 800 MG tablet Take 1 tablet (800 mg total) by mouth 3 (three) times daily. 21 tablet Darleene Cumpian C, PA-C   Cetirizine HCl 10 MG CAPS Take 1 capsule (10 mg total) by mouth daily for 10 days. 10 capsule Chantee Cerino,  Erinn Mendosa C, PA-C      PDMP not reviewed this encounter.   Lew Dawes, [place] 01/20/21 1532

## 2021-01-21 LAB — SARS-COV-2, NAA 2 DAY TAT

## 2021-01-21 LAB — NOVEL CORONAVIRUS, NAA: SARS-CoV-2, NAA: NOT DETECTED

## 2021-06-09 ENCOUNTER — Encounter: Payer: Self-pay | Admitting: Emergency Medicine

## 2021-06-09 ENCOUNTER — Other Ambulatory Visit: Payer: Self-pay

## 2021-06-09 ENCOUNTER — Ambulatory Visit
Admission: EM | Admit: 2021-06-09 | Discharge: 2021-06-09 | Disposition: A | Discharge status: Home / Self Care | Payer: Self-pay | Attending: Physician Assistant | Admitting: Physician Assistant

## 2021-06-09 DIAGNOSIS — Z113 Encounter for screening for infections with a predominantly sexual mode of transmission: Secondary | ICD-10-CM

## 2021-06-09 NOTE — Discharge Instructions (Signed)
Avoid sexual intercourse until results are available and treatment is completed if needed. Follow up with any further concerns.

## 2021-06-09 NOTE — ED Triage Notes (Signed)
Patient requesting to be tested for STD, no sx's.  Girlfriend is having sx's.

## 2021-06-09 NOTE — ED Provider Notes (Signed)
[facility]    CSN: [id number] Arrival date & time: 06/09/21  0910      History   Chief Complaint Chief Complaint  Patient presents with   Exposure to STD    HPI Mason Brown is a 25 y.o. male.   Patient here today for evaluation of possible STD exposure.  He states that his girlfriend is currently having symptoms.  He denies any symptoms including penile discharge, dysuria, or genital lesions.  He has not had fever or chills.  The history is provided by the patient.  Exposure to STD   History reviewed. No pertinent past medical history.  Patient Active Problem List   Diagnosis Date Noted   PHARYNGITIS 12/24/2009   GASTROENTERITIS 12/24/2009    History reviewed. No pertinent surgical history.     Home Medications    Prior to Admission medications   Medication Sig Start Date End Date Taking? Authorizing Provider  albuterol (VENTOLIN HFA) 108 (90 Base) MCG/ACT inhaler Inhale 1-2 puffs into the lungs every 4 (four) hours as needed for wheezing or shortness of breath. 09/13/19   Georgetta Haber, NP  Cetirizine HCl 10 MG CAPS Take 1 capsule (10 mg total) by mouth daily for 10 days. 01/20/21 01/30/21  Wieters, Hallie C, PA-C  ibuprofen (ADVIL) 800 MG tablet Take 1 tablet (800 mg total) by mouth 3 (three) times daily. 01/20/21   Wieters, Hallie C, PA-C  ondansetron (ZOFRAN) 4 MG tablet Take 1 tablet (4 mg total) by mouth every 8 (eight) hours as needed for nausea or vomiting. 09/13/19   Georgetta Haber, NP    Family History No family history on file.  Social History Social History   Tobacco Use   Smoking status: Every Day    Types: Cigars   Smokeless tobacco: Never  Vaping Use   Vaping Use: Former  Substance Use Topics   Alcohol use: Yes    Comment: occasionally   Drug use: Yes    Types: Marijuana    Comment: QD     Allergies   Patient has no known allergies.   Review of Systems Review of Systems  Constitutional:  Negative for chills and  fever.  Eyes:  Negative for discharge and redness.  Genitourinary:  Negative for dysuria, genital sores and penile discharge.  Skin:  Positive for color change and wound.  Neurological:  Negative for numbness.    Physical Exam Triage Vital Signs ED Triage Vitals  Enc Vitals Group     BP 06/09/21 1200 135/68     Pulse Rate 06/09/21 1200 61     Resp --      Temp 06/09/21 1200 98.3 F (36.8 C)     Temp Source 06/09/21 1200 Oral     SpO2 06/09/21 1200 98 %     Weight 06/09/21 1202 165 lb (74.8 kg)     Height 06/09/21 1202 5\' 7"  (1.702 m)     Head Circumference --      Peak Flow --      Pain Score 06/09/21 1202 0     Pain Loc --      Pain Edu? --      Excl. in GC? --    No data found.  Updated Vital Signs BP 135/68 (BP Location: Right Arm)   Pulse 61   Temp 98.3 F (36.8 C) (Oral)   Ht 5\' 7"  (1.702 m)   Wt 165 lb (74.8 kg)   SpO2 98%   BMI 25.84  kg/m      Physical Exam Vitals and nursing note reviewed.  Constitutional:      General: He is not in acute distress.    Appearance: Normal appearance. He is not ill-appearing.  HENT:     Head: Normocephalic and atraumatic.  Eyes:     Conjunctiva/sclera: Conjunctivae normal.  Cardiovascular:     Rate and Rhythm: Normal rate.  Pulmonary:     Effort: Pulmonary effort is normal.  Neurological:     Mental Status: He is alert.  Psychiatric:        Mood and Affect: Mood normal.        Behavior: Behavior normal.        Thought Content: Thought content normal.     UC Treatments / Results  Labs (all labs ordered are listed, but only abnormal results are displayed) Labs Reviewed  CYTOLOGY, (ORAL, ANAL, URETHRAL) ANCILLARY ONLY    EKG   Radiology No results found.  Procedures Procedures (including critical care time)  Medications Ordered in UC Medications - No data to display  Initial Impression / Assessment and Plan / UC Course  I have reviewed the triage vital signs and the nursing notes.  Pertinent labs  & imaging results that were available during my care of the patient were reviewed by me and considered in my medical decision making (see chart for details).    STD screening ordered.  Will await results for further recommendation regarding treatment.  Recommended no sexual activity while awaiting results.  Patient expresses understanding.  Final Clinical Impressions(s) / UC Diagnoses   Final diagnoses:  Screening for STD (sexually transmitted disease)     Discharge Instructions      Avoid sexual intercourse until results are available and treatment is completed if needed. Follow up with any further concerns.      ED Prescriptions   None    PDMP not reviewed this encounter.   Tomi Bamberger, PA-C 06/09/21 1226

## 2021-06-10 LAB — CYTOLOGY, (ORAL, ANAL, URETHRAL) ANCILLARY ONLY
Chlamydia: NEGATIVE
Comment: NEGATIVE
Comment: NEGATIVE
Comment: NORMAL
Neisseria Gonorrhea: NEGATIVE
Trichomonas: NEGATIVE

## 2021-11-09 ENCOUNTER — Ambulatory Visit
Admission: RE | Admit: 2021-11-09 | Discharge: 2021-11-09 | Disposition: A | Discharge status: Home / Self Care | Payer: Self-pay | Source: Ambulatory Visit | Attending: Family Medicine | Admitting: Family Medicine

## 2021-11-09 VITALS — BP 121/71 | HR 72 | Temp 98.4°F | Resp 17

## 2021-11-09 DIAGNOSIS — R1110 Vomiting, unspecified: Secondary | ICD-10-CM

## 2021-11-09 NOTE — ED Provider Notes (Signed)
?[facility] ? ? ? ?CSN: [id number] ?Arrival date & time: 11/09/21  1451 ? ? ?  ? ?History   ?Chief Complaint ?Chief Complaint  ?Patient presents with  ? Nausea  ?  i think i have a stomach virus & ive been throwing up for the past few days . - Entered by patient  ? Abdominal Pain  ? ? ?HPI ?Mason Brown is a 26 y.o. male.  ? ? ?Abdominal Pain ?Here for some nausea and stomach pain.  On March 31 he had some abdominal pain and vomited once.  After that he mainly just had some epigastric pain that was fairly low level and then it improved by the third.  Currently its rated at about a 2/10.  No nausea now.  No diarrhea and no fever. ? ?History reviewed. No pertinent past medical history. ? ?Patient Active Problem List  ? Diagnosis Date Noted  ? PHARYNGITIS 12/24/2009  ? GASTROENTERITIS 12/24/2009  ? ? ?History reviewed. No pertinent surgical history. ? ? ? ? ?Home Medications   ? ?Prior to Admission medications   ?Medication Sig Start Date End Date Taking? Authorizing Provider  ?albuterol (VENTOLIN HFA) 108 (90 Base) MCG/ACT inhaler Inhale 1-2 puffs into the lungs every 4 (four) hours as needed for wheezing or shortness of breath. 09/13/19   Georgetta Haber, NP  ?Cetirizine HCl 10 MG CAPS Take 1 capsule (10 mg total) by mouth daily for 10 days. 01/20/21 01/30/21  Wieters, Hallie C, PA-C  ?ibuprofen (ADVIL) 800 MG tablet Take 1 tablet (800 mg total) by mouth 3 (three) times daily. 01/20/21   Wieters, Hallie C, PA-C  ?ondansetron (ZOFRAN) 4 MG tablet Take 1 tablet (4 mg total) by mouth every 8 (eight) hours as needed for nausea or vomiting. 09/13/19   Georgetta Haber, NP  ? ? ?Family History ?History reviewed. No pertinent family history. ? ?Social History ?Social History  ? ?Tobacco Use  ? Smoking status: Every Day  ?  Types: Cigars  ? Smokeless tobacco: Never  ?Vaping Use  ? Vaping Use: Former  ?Substance Use Topics  ? Alcohol use: Yes  ?  Comment: occasionally  ? Drug use: Yes  ?  Types: Marijuana  ?  Comment:  QD  ? ? ? ?Allergies   ?Patient has no known allergies. ? ? ?Review of Systems ?Review of Systems  ?Gastrointestinal:  Positive for abdominal pain.  ? ? ?Physical Exam ?Triage Vital Signs ?ED Triage Vitals  ?Enc Vitals Group  ?   BP 11/09/21 1516 121/71  ?   Pulse Rate 11/09/21 1516 72  ?   Resp 11/09/21 1516 17  ?   Temp 11/09/21 1516 98.4 ?F (36.9 ?C)  ?   Temp src --   ?   SpO2 11/09/21 1516 98 %  ?   Weight --   ?   Height --   ?   Head Circumference --   ?   Peak Flow --   ?   Pain Score 11/09/21 1515 2  ?   Pain Loc --   ?   Pain Edu? --   ?   Excl. in GC? --   ? ?No data found. ? ?Updated Vital Signs ?BP 121/71   Pulse 72   Temp 98.4 ?F (36.9 ?C)   Resp 17   SpO2 98%  ? ?Visual Acuity ?Right Eye Distance:   ?Left Eye Distance:   ?Bilateral Distance:   ? ?Right Eye Near:   ?  Left Eye Near:    ?Bilateral Near:    ? ?Physical Exam ?Vitals reviewed.  ?Constitutional:   ?   General: He is not in acute distress. ?   Appearance: He is not toxic-appearing.  ?HENT:  ?   Mouth/Throat:  ?   Mouth: Mucous membranes are moist.  ?Cardiovascular:  ?   Rate and Rhythm: Normal rate and regular rhythm.  ?   Heart sounds: No murmur heard. ?Pulmonary:  ?   Effort: Pulmonary effort is normal.  ?   Breath sounds: Normal breath sounds.  ?Abdominal:  ?   Palpations: Abdomen is soft. There is no mass.  ?   Tenderness: There is no abdominal tenderness. There is no guarding.  ?Skin: ?   Coloration: Skin is not jaundiced or pale.  ?Neurological:  ?   Mental Status: He is alert and oriented to person, place, and time.  ?Psychiatric:     ?   Behavior: Behavior normal.  ? ? ? ?UC Treatments / Results  ?Labs ?(all labs ordered are listed, but only abnormal results are displayed) ?Labs Reviewed - No data to display ? ?EKG ? ? ?Radiology ?No results found. ? ?Procedures ?Procedures (including critical care time) ? ?Medications Ordered in UC ?Medications - No data to display ? ?Initial Impression / Assessment and Plan / UC Course  ?I have  reviewed the triage vital signs and the nursing notes. ? ?Pertinent labs & imaging results that were available during my care of the patient were reviewed by me and considered in my medical decision making (see chart for details). ? ?  ? ?He mainly needs a note for his job to get back to work. ?Final Clinical Impressions(s) / UC Diagnoses  ? ?Final diagnoses:  ?Vomiting, unspecified vomiting type, unspecified whether nausea present  ? ? ? ?Discharge Instructions   ? ?  ?You could take famotidine 20 mg over-the-counter--1 tab twice daily for the next week.  This can help if you have any irritation in your stomach after the vomiting and virus episode. ? ? ? ? ?ED Prescriptions   ?None ?  ? ?PDMP not reviewed this encounter. ?  ?Zenia Resides, MD ?11/09/21 1535 ? ?

## 2021-11-09 NOTE — ED Triage Notes (Addendum)
Pt is present tody with upper abdominal pain. Pt states that he may had a stomach bug last Friday which caused vomiting. Pt thinks he may have thrown up excessively to the point where it has caused abdominal pain. Pt denies any nausea or vomiting today just slight stomach pain. ?

## 2021-11-09 NOTE — Discharge Instructions (Addendum)
You could take famotidine 20 mg over-the-counter--1 tab twice daily for the next week.  This can help if you have any irritation in your stomach after the vomiting and virus episode. ?

## 2022-03-04 ENCOUNTER — Ambulatory Visit
Admission: EM | Admit: 2022-03-04 | Discharge: 2022-03-04 | Disposition: A | Discharge status: Home / Self Care | Payer: Self-pay | Attending: Family Medicine | Admitting: Family Medicine

## 2022-03-04 DIAGNOSIS — Z113 Encounter for screening for infections with a predominantly sexual mode of transmission: Secondary | ICD-10-CM | POA: Insufficient documentation

## 2022-03-04 NOTE — ED Triage Notes (Signed)
Pt presents to uc with concern for sti, requesting check after new sexual partner no symptoms currently

## 2022-03-04 NOTE — ED Provider Notes (Signed)
  [facility]   [id number] 03/04/22 Arrival Time: 1139  ASSESSMENT & PLAN:  1. Screening for STDs (sexually transmitted diseases)    No empiric tx given.    Discharge Instructions      We have sent testing for sexually transmitted infections. We will notify you of any positive results once they are received. If required, we will prescribe any medications you might need.  Please refrain from all sexual activity for at least the next seven days.     Pending: Labs Reviewed  CYTOLOGY, (ORAL, ANAL, URETHRAL) ANCILLARY ONLY    Will notify of any positive results. Instructed to refrain from sexual activity for at least seven days.  Reviewed expectations re: course of current medical issues. Questions answered. Outlined signs and symptoms indicating need for more acute intervention. Patient verbalized understanding. After Visit Summary given.   SUBJECTIVE:  Mason Brown is a 26 y.o. male who requests STD testing. New partner. Afebrile. No abdominal or pelvic pain. No n/v. No rashes or lesions. Reports that he is sexually active with single male partner.  OBJECTIVE:  Vitals:   03/04/22 1201  BP: 109/67  Pulse: 68  Resp: 18  Temp: (!) 97.4 F (36.3 C)  SpO2: 97%    GU: deferred Skin: warm and dry Psychological: alert and cooperative; normal mood and affect.  Results for orders placed or performed during the hospital encounter of 06/09/21  Cytology (oral, anal, urethral) ancillary only  Result Value Ref Range   Neisseria Gonorrhea Negative    Chlamydia Negative    Trichomonas Negative    Comment Normal Reference Range Trichomonas - Negative    Comment Normal Reference Ranger Chlamydia - Negative    Comment      Normal Reference Range Neisseria Gonorrhea - Negative    Labs Reviewed  CYTOLOGY, (ORAL, ANAL, URETHRAL) ANCILLARY ONLY    No Known Allergies  No past medical history on file. No family history on file. Social History    Socioeconomic History   Marital status: Single    Spouse name: Not on file   Number of children: Not on file   Years of education: Not on file   Highest education level: Not on file  Occupational History   Not on file  Tobacco Use   Smoking status: Every Day    Types: Cigars   Smokeless tobacco: Never  Vaping Use   Vaping Use: Former  Substance and Sexual Activity   Alcohol use: Yes    Comment: occasionally   Drug use: Yes    Types: Marijuana    Comment: QD   Sexual activity: Yes  Other Topics Concern   Not on file  Social History Narrative   Not on file   Social Determinants of Health   Financial Resource Strain: Not on file  Food Insecurity: Not on file  Transportation Needs: Not on file  Physical Activity: Not on file  Stress: Not on file  Social Connections: Not on file  Intimate Partner Violence: Not on file           Mardella Layman, MD 03/04/22 1259

## 2022-03-04 NOTE — Discharge Instructions (Signed)
We have sent testing for sexually transmitted infections. We will notify you of any positive results once they are received. If required, we will prescribe any medications you might need.  Please refrain from all sexual activity for at least the next seven days.  

## 2022-03-06 LAB — CYTOLOGY, (ORAL, ANAL, URETHRAL) ANCILLARY ONLY
Chlamydia: NEGATIVE
Comment: NEGATIVE
Comment: NEGATIVE
Comment: NORMAL
Neisseria Gonorrhea: NEGATIVE
Trichomonas: NEGATIVE

## 2022-05-09 ENCOUNTER — Emergency Department (HOSPITAL_COMMUNITY)
Admission: EM | Admit: 2022-05-09 | Discharge: 2022-05-10 | Disposition: A | Discharge status: Home / Self Care | Payer: Self-pay | Attending: Emergency Medicine | Admitting: Emergency Medicine

## 2022-05-09 ENCOUNTER — Other Ambulatory Visit: Payer: Self-pay

## 2022-05-09 ENCOUNTER — Encounter (HOSPITAL_COMMUNITY): Payer: Self-pay | Admitting: Emergency Medicine

## 2022-05-09 ENCOUNTER — Emergency Department (HOSPITAL_COMMUNITY): Payer: Self-pay

## 2022-05-09 DIAGNOSIS — S61012A Laceration without foreign body of left thumb without damage to nail, initial encounter: Secondary | ICD-10-CM | POA: Insufficient documentation

## 2022-05-09 DIAGNOSIS — Z23 Encounter for immunization: Secondary | ICD-10-CM | POA: Insufficient documentation

## 2022-05-09 DIAGNOSIS — S6992XA Unspecified injury of left wrist, hand and finger(s), initial encounter: Secondary | ICD-10-CM | POA: Diagnosis present

## 2022-05-09 DIAGNOSIS — Y990 Civilian activity done for income or pay: Secondary | ICD-10-CM | POA: Diagnosis not present

## 2022-05-09 DIAGNOSIS — W260XXA Contact with knife, initial encounter: Secondary | ICD-10-CM | POA: Insufficient documentation

## 2022-05-09 MED ORDER — LIDOCAINE HCL (PF) 1 % IJ SOLN
INTRAMUSCULAR | Status: AC
  Filled 2022-05-09: qty 5

## 2022-05-09 MED ORDER — CEFDINIR 300 MG PO CAPS
300.0000 mg | ORAL_CAPSULE | Freq: Once | ORAL | Status: AC
  Administered 2022-05-09: 300 mg via ORAL
  Filled 2022-05-09: qty 1

## 2022-05-09 MED ORDER — LIDOCAINE HCL (PF) 1 % IJ SOLN
10.0000 mL | Freq: Once | INTRAMUSCULAR | Status: AC
  Administered 2022-05-09: 10 mL
  Filled 2022-05-09: qty 10

## 2022-05-09 MED ORDER — TETANUS-DIPHTH-ACELL PERTUSSIS 5-2.5-18.5 LF-MCG/0.5 IM SUSY
0.5000 mL | PREFILLED_SYRINGE | Freq: Once | INTRAMUSCULAR | Status: AC
  Administered 2022-05-09: 0.5 mL via INTRAMUSCULAR
  Filled 2022-05-09: qty 0.5

## 2022-05-09 NOTE — ED Triage Notes (Signed)
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

## 2022-05-09 NOTE — ED Provider Notes (Signed)
[facility] EMERGENCY DEPARTMENT Provider Note   CSN: [id number] Arrival date & time: 05/09/22  2003     History  Chief Complaint  Patient presents with   Thumb Laceration     Mason Brown is a 26 y.o. male.  Right-hand-dominant male otherwise healthy who presents with left thumb laceration after cutting meat tonight at work.  He works at a [employer] and was using a sharp knife to cut raw chicken when he missed and cut the end of his thumb.  He did say it went deep.  He had immediate pain.  He sustained no other injuries.  He denies any loss of sensation numbness or tingling.  He is able to move his thumb with full range of motion.  HPI     Home Medications Prior to Admission medications   Medication Sig Start Date End Date Taking? Authorizing Provider  cefdinir (OMNICEF) 300 MG capsule Take 1 capsule (300 mg total) by mouth 2 (two) times daily for 5 days. 05/10/22 05/15/22 Yes Mardene Sayer, MD  doxycycline (VIBRAMYCIN) 100 MG capsule Take 1 capsule (100 mg total) by mouth 2 (two) times daily for 5 days. 05/10/22 05/15/22 Yes Mardene Sayer, MD      Allergies    Patient has no known allergies.    Review of Systems   Review of Systems  Physical Exam Updated Vital Signs BP 120/76 (BP Location: Right Arm)   Pulse (!) 56   Temp 97.9 F (36.6 C) (Oral)   Resp 16   SpO2 100%  Physical Exam Constitutional: Alert and oriented. Well appearing and in no distress. Eyes: Conjunctivae are normal. ENT      Head: Normocephalic and atraumatic. Cardiovascular: S1, S2,  Normal and symmetric distal pulses are present in all extremities.Warm and well perfused. Respiratory: Normal respiratory effort. Musculoskeletal: Normal range of motion in all extremities. Full extension and flexion intact of left thumb IP and MP. Sensation intact distally. Neurologic: Normal speech and language. No gross focal neurologic deficits are appreciated. Skin: Skin is  warm, dry.1.25 cm laceration of left distal medial thumb horizontal and linear through distal nail Psychiatric: Mood and affect are normal. Speech and behavior are normal.  ED Results / Procedures / Treatments   Labs (all labs ordered are listed, but only abnormal results are displayed) Labs Reviewed - No data to display  EKG None  Radiology DG Finger Thumb Left  Result Date: 05/09/2022 CLINICAL DATA:  Cut despite of left thumb while cutting chicken. Pain. EXAM: LEFT THUMB 2+V COMPARISON:  None Available. FINDINGS: Normal bone mineralization. Joint spaces are preserved. No acute fracture is seen. No dislocation. No radiopaque foreign body is seen. Mild soft tissue wound is seen at the distal medial aspect of the thumb adjacent to the nail. IMPRESSION: Mild soft tissue wound at the distal medial aspect of the thumb. No acute fracture. Electronically Signed   By: Neita Garnet M.D.   On: 05/09/2022 22:23    Procedures .Marland KitchenLaceration Repair  Date/Time: 05/10/2022 10:52 AM  Performed by: Mardene Sayer, MD Authorized by: Mardene Sayer, MD   Consent:    Consent obtained:  Verbal   Consent given by:  Patient   Risks discussed:  Poor cosmetic result, poor wound healing, pain and infection Anesthesia:    Anesthesia method:  Nerve block   Block location:  Digital nerve block of left thumb   Block needle gauge:  27 G   Block anesthetic:  Lidocaine  1% w/o epi   Block injection procedure:  Anatomic landmarks identified   Block outcome:  Anesthesia achieved Laceration details:    Location: distal left thumb involving nail.   Length (cm):  1.3 Exploration:    Hemostasis achieved with:  Tourniquet   Imaging obtained: x-ray     Imaging outcome: foreign body noted     Wound exploration: wound explored through full range of motion   Treatment:    Area cleansed with:  Soap and water   Amount of cleaning:  Extensive   Irrigation solution:  Tap water   Irrigation volume:  3000 cc    Irrigation method:  Tap   Visualized foreign bodies/material removed: no     Debridement:  None Skin repair:    Repair method:  Sutures   Suture size:  5-0   Suture material:  Fast-absorbing gut   Suture technique:  Simple interrupted   Number of sutures:  4 Repair type:    Repair type:  Intermediate (Removal of damaged nail and repair) Post-procedure details:    Dressing: Xeroform and dressing.   Procedure completion:  Tolerated well, no immediate complications    Medications Ordered in ED Medications  Tdap (BOOSTRIX) injection 0.5 mL (0.5 mLs Intramuscular Given 05/09/22 2237)  lidocaine (PF) (XYLOCAINE) 1 % injection 10 mL (10 mLs Other Given 05/09/22 2327)  cefdinir (OMNICEF) capsule 300 mg (300 mg Oral Given 05/09/22 2329)  ibuprofen (ADVIL) tablet 600 mg (600 mg Oral Given 05/10/22 0024)    ED Course/ Medical Decision Making/ A&P                           Medical Decision Making Mason Brown is a 26 y.o. male.  Right-hand-dominant male otherwise healthy who presents with left thumb laceration after cutting meat tonight at work.    Thumb neurovascularly intact.  Full range of motion at joint spaces, no concern for tendon injury.  X-ray showed no underlying fracture of phalanx and no retained foreign body.  Laceration involves distal thumb and fingernail.  Damage fingernail removed and cleaned extensively and repaired with absorbable sutures as detailed in procedure note.  Because he was using a knife that was for raw chicken, he was started on cefdinir for Salmonella coverage and doxycycline for MRSA coverage x5 days.  Did discuss wound and infection precautions with patient. Provided with hand surgeon follow-up.  Risk Prescription drug management.   mpression(s) / ED Diagnoses Final diagnoses:  Thumb laceration, left, initial encounter    Rx / DC Orders ED Discharge Orders          Ordered    cefdinir (OMNICEF) 300 MG capsule  2 times daily        05/10/22 0019     Ambulatory referral to Hand Surgery        05/10/22 0019    doxycycline (VIBRAMYCIN) 100 MG capsule  2 times daily        05/10/22 0022              [name], MD 05/10/22 1100

## 2022-05-09 NOTE — ED Provider Triage Note (Signed)
Emergency Medicine Provider Triage Evaluation Note  KORBIN MAPPS , a 26 y.o. male  was evaluated in triage.  Pt complains of laceration to left thumb.  Patient is a Biomedical scientist, right-hand-dominant.  Denies paresthesias, unsure last tetanus.  Not on blood thinners. Review of Systems  Per HPI  Physical Exam  BP 130/74 (BP Location: Right Arm)   Pulse (!) 58   Temp 98.4 F (36.9 C) (Oral)   Resp 20   SpO2 100%  Gen:   Awake, no distress   Resp:  Normal effort  MSK:   Moves extremities without difficulty  Other:  Tolerates ROM, some tenderness in the distal part of his left thumb.  Laceration to left thumb that starts ventral side lateral part of nailbed, does not appear to actually cut through the nail.  Radial pulses 2+, cap refills less than 2  Medical Decision Making  Medically screening exam initiated at 9:45 PM.  Appropriate orders placed.  GLENMORE KARL was informed that the remainder of the evaluation will be completed by another provider, this initial triage assessment does not replace that evaluation, and the importance of remaining in the ED until their evaluation is complete.     Sherrill Raring, PA-C 05/09/22 2146

## 2022-05-09 NOTE — ED Notes (Signed)
X1 call for xray with no response

## 2022-05-10 MED ORDER — CEFDINIR 300 MG PO CAPS: 300.0000 mg | ORAL_CAPSULE | Freq: Two times a day (BID) | ORAL | 0 refills | Status: AC

## 2022-05-10 MED ORDER — IBUPROFEN 400 MG PO TABS
600.0000 mg | ORAL_TABLET | Freq: Once | ORAL | Status: AC
  Administered 2022-05-10: 600 mg via ORAL
  Filled 2022-05-10: qty 1

## 2022-05-10 MED ORDER — DOXYCYCLINE HYCLATE 100 MG PO CAPS: 100.0000 mg | ORAL_CAPSULE | Freq: Two times a day (BID) | ORAL | 0 refills | Status: AC

## 2022-05-10 NOTE — Discharge Instructions (Addendum)
You had a laceration of your left thumb that involved your fingernail.  Absorbable sutures were placed really do not have to be removed.  Your tetanus was updated.  Take the antibiotics as prescribed.  Keep the wound clean and dry for the next 24 hours and then continue to clean it with soap and water and keep clean tight dressings on it as it continues to heal together.  Come back for any signs of infection such as fevers, increased redness and swelling, increased pain with movement of the joint, swelling of the whole finger, or any other symptoms concerning to you.  Take Tylenol and ibuprofen for pain control.  Call the phone number under Dr. Greta Doom to make an appointment with the hand surgeon for follow-up.

## 2022-05-10 NOTE — ED Notes (Signed)
Discharge instructions including prescription, pain management, wound care, and follow up care with hang surg discussed with pt. Pt verbalized understanding with no questions at this time. Pt to go home with s/o at bedside.

## 2022-08-22 ENCOUNTER — Ambulatory Visit
Admission: RE | Admit: 2022-08-22 | Disposition: A | Discharge status: Home / Self Care | Payer: Self-pay | Source: Ambulatory Visit

## 2022-08-28 ENCOUNTER — Ambulatory Visit
Admission: RE | Admit: 2022-08-28 | Discharge: 2022-08-28 | Disposition: A | Discharge status: Home / Self Care | Payer: Commercial Managed Care - PPO | Source: Ambulatory Visit | Attending: Internal Medicine | Admitting: Internal Medicine

## 2022-08-28 VITALS — BP 120/66 | HR 74 | Temp 98.3°F | Resp 18 | Ht 67.0 in | Wt 150.0 lb

## 2022-08-28 DIAGNOSIS — Z113 Encounter for screening for infections with a predominantly sexual mode of transmission: Secondary | ICD-10-CM | POA: Insufficient documentation

## 2022-08-28 NOTE — Discharge Instructions (Signed)
Your STD test is pending.  We will call if it is positive and treat as appropriate if necessary.  We ask that you refrain from sexual activity until test results are complete.

## 2022-08-28 NOTE — ED Triage Notes (Signed)
Patient requesting STI testing.  Does not want bloodwork.  No sx's.

## 2022-08-28 NOTE — ED Provider Notes (Signed)
[facility]    CSN: [id number] Arrival date & time: 08/28/22  1117      History   Chief Complaint Chief Complaint  Patient presents with   SEXUALLY TRANSMITTED DISEASE    Coming to get an STD test done , No symptoms - Entered by patient    HPI Mason Brown is a 27 y.o. male.   Patient presents today for STD testing.  He denies any confirmed exposure or any associated symptoms.  Patient reports that him and his girlfriend recently broke up and he had sexual intercourse with a new sexual partner so he wants to be sure that he is free from infection.  Patient declines blood work for HIV or syphilis.     History reviewed. No pertinent past medical history.  Patient Active Problem List   Diagnosis Date Noted   PHARYNGITIS 12/24/2009   GASTROENTERITIS 12/24/2009    History reviewed. No pertinent surgical history.     Home Medications    Prior to Admission medications   Not on File    Family History Family History  Problem Relation Age of Onset   Healthy Mother    Healthy Father     Social History Social History   Tobacco Use   Smoking status: Every Day    Types: Cigars   Smokeless tobacco: Never  Vaping Use   Vaping Use: Former  Substance Use Topics   Alcohol use: Yes    Comment: occasionally   Drug use: Yes    Types: Marijuana    Comment: QD     Allergies   Patient has no known allergies.   Review of Systems Review of Systems Per HPI  Physical Exam Triage Vital Signs ED Triage Vitals  Enc Vitals Group     BP 08/28/22 1131 120/66     Pulse Rate 08/28/22 1131 74     Resp 08/28/22 1131 18     Temp 08/28/22 1131 98.3 F (36.8 C)     Temp Source 08/28/22 1131 Oral     SpO2 08/28/22 1131 96 %     Weight 08/28/22 1133 150 lb (68 kg)     Height 08/28/22 1133 5\' 7"  (1.702 m)     Head Circumference --      Peak Flow --      Pain Score 08/28/22 1133 0     Pain Loc --      Pain Edu? --      Excl. in [place]? --    No data  found.  Updated Vital Signs BP 120/66 (BP Location: Left Arm)   Pulse 74   Temp 98.3 F (36.8 C) (Oral)   Resp 18   Ht 5\' 7"  (1.702 m)   Wt 150 lb (68 kg)   SpO2 96%   BMI 23.49 kg/m   Visual Acuity Right Eye Distance:   Left Eye Distance:   Bilateral Distance:    Right Eye Near:   Left Eye Near:    Bilateral Near:     Physical Exam Constitutional:      General: He is not in acute distress.    Appearance: Normal appearance. He is not toxic-appearing or diaphoretic.  HENT:     Head: Normocephalic and atraumatic.  Eyes:     Extraocular Movements: Extraocular movements intact.     Conjunctiva/sclera: Conjunctivae normal.  Pulmonary:     Effort: Pulmonary effort is normal.  Genitourinary:    Comments: Deferred with shared decision making.  Self swab  performed. Neurological:     General: No focal deficit present.     Mental Status: He is alert and oriented to person, place, and time. Mental status is at baseline.  Psychiatric:        Mood and Affect: Mood normal.        Behavior: Behavior normal.        Thought Content: Thought content normal.        Judgment: Judgment normal.      UC Treatments / Results  Labs (all labs ordered are listed, but only abnormal results are displayed) Labs Reviewed  CYTOLOGY, (ORAL, ANAL, URETHRAL) ANCILLARY ONLY    EKG   Radiology No results found.  Procedures Procedures (including critical care time)  Medications Ordered in UC Medications - No data to display  Initial Impression / Assessment and Plan / UC Course  I have reviewed the triage vital signs and the nursing notes.  Pertinent labs & imaging results that were available during my care of the patient were reviewed by me and considered in my medical decision making (see chart for details).     Patient presents for STD testing.  Cytology swab pending.  Patient declined blood work for HIV and syphilis.  Given no confirmed exposure to STD, will await results for  any treatment if necessary.  Discussed return precautions.  Patient verbalized understanding and was agreeable with plan. Final Clinical Impressions(s) / UC Diagnoses   Final diagnoses:  Screening examination for venereal disease     Discharge Instructions      Your STD test is pending.  We will call if it is positive and treat as appropriate if necessary.  We ask that you refrain from sexual activity until test results are complete.    ED Prescriptions   None    PDMP not reviewed this encounter.   Teodora Medici, [place] 08/28/22 1150

## 2022-08-29 LAB — CYTOLOGY, (ORAL, ANAL, URETHRAL) ANCILLARY ONLY
Chlamydia: NEGATIVE
Comment: NEGATIVE
Comment: NEGATIVE
Comment: NORMAL
Neisseria Gonorrhea: NEGATIVE
Trichomonas: NEGATIVE

## 2022-12-30 ENCOUNTER — Ambulatory Visit
Admission: RE | Admit: 2022-12-30 | Discharge: 2022-12-30 | Disposition: A | Discharge status: Home / Self Care | Payer: Commercial Managed Care - PPO | Source: Ambulatory Visit | Attending: Family Medicine | Admitting: Family Medicine

## 2022-12-30 ENCOUNTER — Other Ambulatory Visit: Payer: Self-pay

## 2022-12-30 VITALS — BP 125/80 | HR 91 | Temp 98.9°F | Resp 18

## 2022-12-30 DIAGNOSIS — J00 Acute nasopharyngitis [common cold]: Secondary | ICD-10-CM | POA: Diagnosis not present

## 2022-12-30 DIAGNOSIS — L209 Atopic dermatitis, unspecified: Secondary | ICD-10-CM

## 2022-12-30 MED ORDER — PREDNISONE 10 MG PO TABS: 10.0000 mg | ORAL_TABLET | Freq: Every day | ORAL | 0 refills | Status: AC

## 2022-12-30 MED ORDER — FLUTICASONE PROPIONATE 50 MCG/ACT NA SUSP: 2.0000 | Freq: Every day | NASAL | 0 refills | Status: AC

## 2022-12-30 MED ORDER — TRIAMCINOLONE ACETONIDE 0.025 % EX CREA: 1.0000 | TOPICAL_CREAM | Freq: Two times a day (BID) | CUTANEOUS | 0 refills | Status: AC | PRN

## 2022-12-30 NOTE — ED Triage Notes (Signed)
Pt here for nasal congestion and rash to arms since getting back from the beach one week ago

## 2022-12-30 NOTE — ED Provider Notes (Signed)
[facility]    CSN: [id number] Arrival date & time: 12/30/22  1348      History   Chief Complaint Chief Complaint  Patient presents with   Rash    Appeared a few days ago - Entered by patient    HPI Mason Brown is a 27 y.o. male.   HPI Patient presents today for evaluation of a bilateral upper extremity rash that developed after recent beach trip.  Patient reports a distant history of eczema and had not had any flares since in a kid.  He has noticed that the area started out as a small localized area and has gradually had diffusely to his bilateral lower forearms.  He reports that the rashes only mildly irritating.  Not have a rash on the rest of his body.  He is also concerned of 3 to 4 days of nasal congestion and drainage.  Reports that he has taken over-the-counter Benadryl which helped temporarily.  He denies any cough or other URI symptoms.  History reviewed. No pertinent past medical history.  Patient Active Problem List   Diagnosis Date Noted   PHARYNGITIS 12/24/2009   GASTROENTERITIS 12/24/2009    History reviewed. No pertinent surgical history.     Home Medications    Prior to Admission medications   Medication Sig Start Date End Date Taking? Authorizing Provider  fluticasone (FLONASE) 50 MCG/ACT nasal spray Place 2 sprays into both nostrils daily. 12/30/22  Yes Bing Neighbors, NP  predniSONE (DELTASONE) 10 MG tablet Take 1 tablet (10 mg total) by mouth daily with breakfast for 5 days. 12/30/22 01/04/23 Yes Bing Neighbors, NP  triamcinolone (KENALOG) 0.025 % cream Apply 1 Application topically 2 (two) times daily as needed. 12/30/22  Yes Bing Neighbors, NP    Family History Family History  Problem Relation Age of Onset   Healthy Mother    Healthy Father     Social History Social History   Tobacco Use   Smoking status: Every Day    Types: Cigars   Smokeless tobacco: Never  Vaping Use   Vaping Use: Former  Substance Use Topics    Alcohol use: Yes    Comment: occasionally   Drug use: Yes    Types: Marijuana    Comment: QD     Allergies   Patient has no known allergies.   Review of Systems Review of Systems Pertinent negatives listed in HPI   Physical Exam Triage Vital Signs ED Triage Vitals [12/30/22 1420]  Enc Vitals Group     BP 125/80     Pulse Rate 91     Resp 18     Temp 98.9 F (37.2 C)     Temp Source Oral     SpO2 98 %     Weight      Height      Head Circumference      Peak Flow      Pain Score 2     Pain Loc      Pain Edu?      Excl. in [place]?     Updated Vital Signs BP 125/80 (BP Location: Left Arm)   Pulse 91   Temp 98.9 F (37.2 C) (Oral)   Resp 18   SpO2 98%   Visual Acuity Right Eye Distance:   Left Eye Distance:   Bilateral Distance:    Right Eye Near:   Left Eye Near:    Bilateral Near:     Physical  Exam Vitals reviewed.  Constitutional:      Appearance: Normal appearance.  HENT:     Head: Normocephalic and atraumatic.  Eyes:     Extraocular Movements: Extraocular movements intact.     Pupils: Pupils are equal, round, and reactive to light.  Cardiovascular:     Rate and Rhythm: Normal rate and regular rhythm.  Pulmonary:     Effort: Pulmonary effort is normal.     Breath sounds: Normal breath sounds.  Skin:    Capillary Refill: Capillary refill takes less than 2 seconds.     Findings: Erythema and rash present.  Neurological:     General: No focal deficit present.     Mental Status: He is alert.      UC Treatments / Results  Labs (all labs ordered are listed, but only abnormal results are displayed) Labs Reviewed - No data to display  EKG   Radiology No results found.  Procedures Procedures (including critical care time)  Medications Ordered in UC Medications - No data to display  Initial Impression / Assessment and Plan / UC Course  I have reviewed the triage vital signs and the nursing notes.  Pertinent labs & imaging results  that were available during my care of the patient were reviewed by me and considered in my medical decision making (see chart for details).    Atopic dermatitis, prednisone 10 mg x 5 days and apply triamcinolone cream to affected area up to twice daily as needed for rash.  Acute rhinitis Flonase 2 sprays in each nares daily as needed.  Also recommended starting over-the-counter allergy medication.  Return if symptoms worsen or do not improve with recommendations.  Final Clinical Impressions(s) / UC Diagnoses   Final diagnoses:  Atopic dermatitis, unspecified type  Acute rhinitis   Discharge Instructions   None    ED Prescriptions     Medication Sig Dispense Auth. Provider   triamcinolone (KENALOG) 0.025 % cream Apply 1 Application topically 2 (two) times daily as needed. 454 g Bing Neighbors, NP   predniSONE (DELTASONE) 10 MG tablet Take 1 tablet (10 mg total) by mouth daily with breakfast for 5 days. 5 tablet Bing Neighbors, NP   fluticasone (FLONASE) 50 MCG/ACT nasal spray Place 2 sprays into both nostrils daily. 11.1 mL Bing Neighbors, NP      PDMP not reviewed this encounter.   Bing Neighbors, NP 01/02/23 [phone]

## 2024-06-23 ENCOUNTER — Other Ambulatory Visit: Payer: Self-pay

## 2024-06-23 ENCOUNTER — Emergency Department (HOSPITAL_COMMUNITY)
Admission: EM | Admit: 2024-06-23 | Discharge: 2024-06-23 | Disposition: A | Discharge status: Home / Self Care | Attending: Emergency Medicine | Admitting: Emergency Medicine

## 2024-06-23 ENCOUNTER — Emergency Department (HOSPITAL_COMMUNITY)

## 2024-06-23 DIAGNOSIS — S00512A Abrasion of oral cavity, initial encounter: Secondary | ICD-10-CM | POA: Insufficient documentation

## 2024-06-23 DIAGNOSIS — X58XXXA Exposure to other specified factors, initial encounter: Secondary | ICD-10-CM | POA: Insufficient documentation

## 2024-06-23 DIAGNOSIS — R569 Unspecified convulsions: Secondary | ICD-10-CM | POA: Diagnosis present

## 2024-06-23 LAB — COMPREHENSIVE METABOLIC PANEL WITH GFR
ALT: 16 U/L (ref 0–44)
AST: 32 U/L (ref 15–41)
Albumin: 4.1 g/dL (ref 3.5–5.0)
Alkaline Phosphatase: 64 U/L (ref 38–126)
Anion gap: 12 (ref 5–15)
BUN: 11 mg/dL (ref 6–20)
CO2: 21 mmol/L — ABNORMAL LOW (ref 22–32)
Calcium: 8.7 mg/dL — ABNORMAL LOW (ref 8.9–10.3)
Chloride: 109 mmol/L (ref 98–111)
Creatinine, Ser: 1.13 mg/dL (ref 0.61–1.24)
GFR, Estimated: >60 mL/min (ref >60)
Glucose, Bld: 89 mg/dL (ref 70–99)
Potassium: 3.6 mmol/L (ref 3.5–5.1)
Sodium: 142 mmol/L (ref 135–145)
Total Bilirubin: 0.6 mg/dL (ref 0.0–1.2)
Total Protein: 7.6 g/dL (ref 6.5–8.1)

## 2024-06-23 LAB — CBC
HCT: 47.4 % (ref 39.0–52.0)
Hemoglobin: 16.1 g/dL (ref 13.0–17.0)
MCH: 31.6 pg (ref 26.0–34.0)
MCHC: 34 g/dL (ref 30.0–36.0)
MCV: 93.1 fL (ref 80.0–100.0)
Platelets: 234 K/uL (ref 150–400)
RBC: 5.09 MIL/uL (ref 4.22–5.81)
RDW: 12.9 % (ref 11.5–15.5)
WBC: 5.5 K/uL (ref 4.0–10.5)
nRBC: 0 % (ref 0.0–0.2)

## 2024-06-23 LAB — CBG MONITORING, ED: Glucose-Capillary: 84 mg/dL (ref 70–99)

## 2024-06-23 NOTE — Discharge Instructions (Addendum)
 Evaluation today is concerning for a first-time seizure.  Please follow-up with neurology.  In the meantime no driving for the next 6 months.  Please do not go swimming by yourself.  Also recommend not using illicit drugs or alcohol as it may increase your risk of having a seizure.  If you have another seizure, passout, chest pain or palpitations or any other concerning symptom please return to the ED for further evaluation.

## 2024-06-23 NOTE — ED Provider Notes (Signed)
 [place] EMERGENCY DEPARTMENT AT [facility] Provider Note   CSN: [id number] Arrival date & time: 06/23/24  1035     Patient presents with: Seizures  HPI Mason Brown is a 28 y.o. male presenting for seizure.  No prior history.  He states that he felt lightheaded and possibly nauseous about an hour ago and then remembers waking up in the EMS truck.  I was also able to talk to his down the phone who reports that he went most of the event.  He characterized the episode as his arms and legs were all tensed up and he was unconscious.  He stayed this lasted for a few seconds and then he seemed to be returning to his baseline and that happened again and lasted for about 15 seconds.  Then he appeared to be drowsy per his dad but more lucid.  Denies head trauma.  Patient report that he may have bit the right side of the tongue.  Denies alcohol or recent drug use.  At this time he is asymptomatic.    Seizures      Prior to Admission medications   Medication Sig Start Date End Date Taking? Authorizing Provider  fluticasone  (FLONASE ) 50 MCG/ACT nasal spray Place 2 sprays into both nostrils daily. 12/30/22   Arloa Suzen RAMAN, NP  triamcinolone  (KENALOG ) 0.025 % cream Apply 1 Application topically 2 (two) times daily as needed. 12/30/22   Arloa Suzen RAMAN, NP    Allergies: Patient has no known allergies.    Review of Systems  Neurological:  Positive for seizures.    Updated Vital Signs BP 125/65 (BP Location: Left Arm)   Pulse 62   Temp (!) 97.4 F (36.3 C)   Resp 18   Ht 5' 7 (1.702 m)   Wt 69.9 kg   SpO2 100%   BMI 24.12 kg/m   Physical Exam Vitals and nursing note reviewed.  HENT:     Head: Normocephalic and atraumatic.     Mouth/Throat:     Mouth: Mucous membranes are moist.   Eyes:     General:        Right eye: No discharge.        Left eye: No discharge.     Conjunctiva/sclera: Conjunctivae normal.  Cardiovascular:     Rate and Rhythm: Normal rate  and regular rhythm.     Pulses: Normal pulses.     Heart sounds: Normal heart sounds.  Pulmonary:     Effort: Pulmonary effort is normal.     Breath sounds: Normal breath sounds.  Abdominal:     General: Abdomen is flat.     Palpations: Abdomen is soft.  Skin:    General: Skin is warm and dry.  Neurological:     General: No focal deficit present.     Comments: GCS 15. Speech is goal oriented. No deficits appreciated to CN III-XII; symmetric eyebrow raise, no facial drooping, tongue midline. Patient has equal grip strength bilaterally with 5/5 strength against resistance in all major muscle groups bilaterally. Sensation to light touch intact. Patient moves extremities without ataxia. Normal finger-nose-finger. Patient ambulatory with steady gait.   Psychiatric:        Mood and Affect: Mood normal.     (all labs ordered are listed, but only abnormal results are displayed) Labs Reviewed  COMPREHENSIVE METABOLIC PANEL WITH GFR - Abnormal; Notable for the following components:      Result Value   CO2 21 (*)  Calcium 8.7 (*)    All other components within normal limits  CBC  URINALYSIS, ROUTINE W REFLEX MICROSCOPIC  RAPID URINE DRUG SCREEN, HOSP PERFORMED  CBG MONITORING, ED    EKG: EKG Interpretation Date/Time:  Monday June 23 2024 10:47:08 EST Ventricular Rate:  65 PR Interval:  174 QRS Duration:  96 QT Interval:  390 QTC Calculation: 405 R Axis:   70  Text Interpretation: Normal sinus rhythm ST elevation, consider early repolarization Borderline ECG No previous ECGs available Confirmed by Patsey Lot [phone]) on 06/23/2024 10:55:20 AM  Radiology: CT Head Wo Contrast Result Date: 06/23/2024 EXAM: CT HEAD WITHOUT CONTRAST 06/23/2024 11:29:28 AM TECHNIQUE: CT of the head was performed without the administration of intravenous contrast. Automated exposure control, iterative reconstruction, and/or weight based adjustment of the mA/kV was utilized to reduce the  radiation dose to as low as reasonably achievable. COMPARISON: None available. CLINICAL HISTORY: Seizure, new-onset, no history of trauma Seizure, new-onset, no history of trauma FINDINGS: BRAIN AND VENTRICLES: No acute hemorrhage. No evidence of acute infarct. No hydrocephalus. No extra-axial collection. No mass effect or midline shift. ORBITS: No acute abnormality. SINUSES: No acute abnormality. SOFT TISSUES AND SKULL: No acute soft tissue abnormality. No skull fracture. IMPRESSION: 1. No acute intracranial abnormality. Electronically signed by: Evalene Coho MD 06/23/2024 11:32 AM EST RP Workstation: [id number]     Procedures   Medications Ordered in the ED - No data to display                                  Medical Decision Making Amount and/or Complexity of Data Reviewed Labs: ordered. Radiology: ordered.   Initial Impression and Ddx 28 year old well-appearing male presenting for seizure-like activity.  Exam notable for abrasion to the right side of his tongue but otherwise reassuring.  DDx includes seizure, electrolyte derangement, syncope, arrhythmia, PE, other. Patient PMH that increases complexity of ED encounter:  none  Interpretation of Diagnostics - I independent reviewed and interpreted the labs as followed: Unremarkable  - I independently visualized the following imaging with scope of interpretation limited to determining acute life threatening conditions related to emergency care: CT head non con, which revealed no acute findings  -I personally reviewed and interpreted EKG which revealed normal sinus rhythm  Patient Reassessment and Ultimate Disposition/Management HPI and exam findings are concerning for first-time seizure. No eliciting precipitating factors here. Workup here including a CT overall reassuring.  Discussed preventative measure at home along with no driving for 6 months and to follow-up with neurology.  Also encouraged no alcohol or drug to prevent  lowering his seizure threshold. Discussed return precautions.  Discharged in good condition.  Patient management required discussion with the following services or consulting groups:  None  Complexity of Problems Addressed Acute complicated illness or Injury  Additional Data Reviewed and Analyzed Further history obtained from: Past medical history and medications listed in the EMR and Prior ED visit notes  Patient Encounter Risk Assessment Consideration of hospitalization       Final diagnoses:  Seizure [facility])    ED Discharge Orders          Ordered    Ambulatory referral to Neurology       Comments: An appointment is requested in approximately: 1 week   06/23/24 1431               Lang Norleen POUR, PA-C 06/23/24 1432    Ray,  Edsel, MD 06/26/24 [phone]

## 2024-06-23 NOTE — ED Triage Notes (Signed)
 Pt had seizure like activity, family described as jerking around, lasting about 1 minute and syncopal episode, no hx of seziures. Pt sweating profusely and vomiting on EMS arrival. AOX4.

## 2024-06-23 NOTE — ED Provider Triage Note (Signed)
 Emergency Medicine Provider Triage Evaluation Note  Mason Brown , a 28 y.o. male  was evaluated in triage.  Pt complains of syncope with possible seizure-like activity.  Review of Systems  Positive: Loss consciousness Negative: Injury, loss of bladder control, tongue biting  Physical Exam  BP 125/65 (BP Location: Left Arm)   Pulse 62   Temp (!) 97.4 F (36.3 C)   Resp 18   Ht 5' 7 (1.702 m)   Wt 69.9 kg   SpO2 100%   BMI 24.12 kg/m  Gen:   Awake, no distress   Regular rate and rhythm on heart.  No loud murmur.  Medical Decision Making  Medically screening exam initiated at 10:55 AM.  Appropriate orders placed.  Mason Brown was informed that the remainder of the evaluation will be completed by another provider, this initial triage assessment does not replace that evaluation, and the importance of remaining in the ED until their evaluation is complete.  Patient with syncopal episode.  Had some apparent shaking.  Differential diagnose includes causes such as a vagal vagal syncope since he had been feeling nauseous but also seizure.  Will get blood work and head CT.  Cardiac and seizure monitoring at this time.   Patsey Lot, MD 06/23/24 1056

## 2024-06-24 ENCOUNTER — Encounter: Payer: Self-pay | Admitting: Neurology

## 2024-08-19 ENCOUNTER — Encounter: Payer: Self-pay | Admitting: Emergency Medicine

## 2024-08-19 ENCOUNTER — Ambulatory Visit
Admission: EM | Admit: 2024-08-19 | Discharge: 2024-08-19 | Disposition: A | Discharge status: Home / Self Care | Payer: Self-pay | Attending: Internal Medicine | Admitting: Internal Medicine

## 2024-08-19 ENCOUNTER — Ambulatory Visit: Payer: Self-pay | Admitting: Neurology

## 2024-08-19 ENCOUNTER — Encounter: Payer: Self-pay | Admitting: Neurology

## 2024-08-19 ENCOUNTER — Telehealth: Payer: Self-pay | Admitting: Emergency Medicine

## 2024-08-19 DIAGNOSIS — Z202 Contact with and (suspected) exposure to infections with a predominantly sexual mode of transmission: Secondary | ICD-10-CM | POA: Insufficient documentation

## 2024-08-19 DIAGNOSIS — Z113 Encounter for screening for infections with a predominantly sexual mode of transmission: Secondary | ICD-10-CM | POA: Insufficient documentation

## 2024-08-19 NOTE — Progress Notes (Unsigned)
 "  NEUROLOGY CONSULTATION NOTE  Mason Brown MRN: [id number] DOB: 06-24-1996  Referring provider: Norleen Essex, PA-C Primary care provider: none listed  Reason for consult:  seizure   Thank you for your kind referral of Mason Brown for consultation of the above symptoms. Although his history is well known to you, please allow me to reiterate it for the purpose of our medical record. The patient was accompanied to the clinic by *** who also provides collateral information. Records and images were personally reviewed where available.  HISTORY OF PRESENT ILLNESS: ***.   11/17: he felt lightheaded and possibly nauseous about an hour ago and then remembers waking up in the EMS truck.  I was also able to talk to his down the phone who reports that he went most of the event.  He characterized the episode as his arms and legs were all tensed up and he was unconscious.  He stayed this lasted for a few seconds and then he seemed to be returning to his baseline and that happened again and lasted for about 15 seconds.  Then he appeared to be drowsy per his dad but more lucid.  Denies head trauma.  Patient report that he may have bit the right side of the tongue  Cbc, BMP nl, head ct nl  Seizure symptoms: The patient denies any olfactory/gustatory hallucinations, deja vu, rising epigastric sensation, focal numbness/tingling/weakness, myoclonic jerks.  Epilepsy Risk Factors:  *** had a normal birth and early development.  There is no history of febrile convulsions, CNS infections such as meningitis/encephalitis, significant traumatic brain injury, neurosurgical procedures, or family history of seizures.  Prior AEDs: Laboratory Data:  EEGs: MRI:   PAST MEDICAL HISTORY: No past medical history on file.  PAST SURGICAL HISTORY: No past surgical history on file.  MEDICATIONS: Medications Ordered Prior to Encounter[1]  ALLERGIES: Allergies[2]  FAMILY HISTORY: Family History  Problem  Relation Age of Onset   Healthy Mother    Healthy Father     SOCIAL HISTORY: Social History   Socioeconomic History   Marital status: Single    Spouse name: Not on file   Number of children: Not on file   Years of education: Not on file   Highest education level: Not on file  Occupational History   Not on file  Tobacco Use   Smoking status: Every Day    Types: Cigars   Smokeless tobacco: Never  Vaping Use   Vaping status: Former  Substance and Sexual Activity   Alcohol use: Yes    Comment: occasionally   Drug use: Yes    Types: Marijuana    Comment: QD   Sexual activity: Yes    Birth control/protection: None  Other Topics Concern   Not on file  Social History Narrative   Not on file   Social Drivers of Health   Tobacco Use: High Risk (12/30/2022)   Patient History    Smoking Tobacco Use: Every Day    Smokeless Tobacco Use: Never    Passive Exposure: Not on file  Financial Resource Strain: Not on file  Food Insecurity: Not on file  Transportation Needs: Not on file  Physical Activity: Not on file  Stress: Not on file  Social Connections: Not on file  Intimate Partner Violence: Not on file  Depression (EYV7-0): Not on file  Alcohol Screen: Not on file  Housing: Not on file  Utilities: Not on file  Health Literacy: Not on file     PHYSICAL EXAM:  There were no vitals filed for this visit. General: No acute distress Head:  Normocephalic/atraumatic Skin/Extremities: No rash, no edema Neurological Exam: Mental status: alert and oriented to person, place, and time, no dysarthria or aphasia, Fund of knowledge is appropriate.  Recent and remote memory are intact.  Attention and concentration are normal.    Able to name objects and repeat phrases. Cranial nerves: CN I: not tested CN II: pupils equal, round and reactive to light, visual fields intact CN III, IV, VI:  full range of motion, no nystagmus, no ptosis CN V: facial sensation intact CN VII: upper and  lower face symmetric CN VIII: hearing intact to conversation Bulk & Tone: normal, no fasciculations. Motor: 5/5 throughout with no pronator drift. Sensation: intact to light touch, cold, pin, vibration and joint position sense.  No extinction to double simultaneous stimulation.  Romberg test *** Deep Tendon Reflexes: +2 throughout, no ankle clonus Plantar responses: downgoing bilaterally Cerebellar: no incoordination on finger to nose, heel to shin. No dysdiadochokinesia Gait: narrow-based and steady, able to tandem walk adequately. Tremor: ***   IMPRESSION: This is a *** year old ***-handed *** with a history of ***.  [place] driving laws were discussed with the patient, and *** knows to stop driving after a seizure, until 6 months seizure-free.    The duration of this appointment visit was *** minutes of face-to-face time with the patient.  Greater than 50% of this time was spent in counseling, explanation of diagnosis, planning of further management, and coordination of care.  Thank you for allowing me to participate in the care of this patient. Please do not hesitate to call for any questions or concerns.   Darice Shivers, M.D.  CC: ***     [1]  Current Outpatient Medications on File Prior to Visit  Medication Sig Dispense Refill   fluticasone  (FLONASE ) 50 MCG/ACT nasal spray Place 2 sprays into both nostrils daily. 11.1 mL 0   triamcinolone  (KENALOG ) 0.025 % cream Apply 1 Application topically 2 (two) times daily as needed. 454 g 0   No current facility-administered medications on file prior to visit.  [2] No Known Allergies  "

## 2024-08-19 NOTE — ED Triage Notes (Signed)
 Pt presents requesting STD testing including blood work. Pt reports no sxs to report.. Last sexual encounter 08/14/2024.   Pt states,  Basically this girl that I had sex with told me that she tested positive for Chlamydia the beginning of Dec. Pt states the girl missed some pills during treatment so she still tested positive as of Friday. So, I need to get tested.

## 2024-08-19 NOTE — Discharge Instructions (Signed)

## 2024-08-19 NOTE — ED Provider Notes (Signed)
 " [facility]    CSN: [id number] Arrival date & time: 08/19/24  1148      History   Chief Complaint Chief Complaint  Patient presents with   Exposure to STD    HPI LEVAN ALOIA is a 29 y.o. male.   MAURISIO RUDDY is a 29 y.o. male presenting for chief complaint of Exposure to STD.  He was recently exposed to chlamydia by recent unprotected male sexual partner.  He would like to be tested to the urethra and oropharynx.  Denies other known exposures to STDs.  He denies penile discharge, urinary symptoms, rashes to the penis, and other concerns.  He is not sexually active with any other partners, only 1 partner in the last month unprotected.   Exposure to STD    History reviewed. No pertinent past medical history.  Patient Active Problem List   Diagnosis Date Noted   PHARYNGITIS 12/24/2009   GASTROENTERITIS 12/24/2009    History reviewed. No pertinent surgical history.     Home Medications    Prior to Admission medications  Medication Sig Start Date End Date Taking? Authorizing Provider  fluticasone  (FLONASE ) 50 MCG/ACT nasal spray Place 2 sprays into both nostrils daily. 12/30/22   Arloa Suzen RAMAN, NP  triamcinolone  (KENALOG ) 0.025 % cream Apply 1 Application topically 2 (two) times daily as needed. 12/30/22   Arloa Suzen RAMAN, NP    Family History Family History  Problem Relation Age of Onset   Healthy Mother    Healthy Father     Social History Social History[1]   Allergies   Patient has no known allergies.   Review of Systems Review of Systems Per HPI  Physical Exam Triage Vital Signs ED Triage Vitals [08/19/24 1319]  Encounter Vitals Group     BP 132/72     Girls Systolic BP Percentile      Girls Diastolic BP Percentile      Boys Systolic BP Percentile      Boys Diastolic BP Percentile      Pulse Rate 69     Resp 16     Temp 98.4 F (36.9 C)     Temp Source Oral     SpO2 97 %     Weight 154 lb 1.6 oz (69.9 kg)      Height      Head Circumference      Peak Flow      Pain Score 0     Pain Loc      Pain Education      Exclude from Growth Chart    No data found.  Updated Vital Signs BP 132/72 (BP Location: Left Arm)   Pulse 69   Temp 98.4 F (36.9 C) (Oral)   Resp 16   Wt 154 lb 1.6 oz (69.9 kg)   SpO2 97%   BMI 24.14 kg/m   Visual Acuity Right Eye Distance:   Left Eye Distance:   Bilateral Distance:    Right Eye Near:   Left Eye Near:    Bilateral Near:     Physical Exam Vitals and nursing note reviewed.  Constitutional:      Appearance: He is not ill-appearing or toxic-appearing.  HENT:     Head: Normocephalic and atraumatic.     Right Ear: Hearing and external ear normal.     Left Ear: Hearing and external ear normal.     Nose: Nose normal.     Mouth/Throat:     Lips:  Pink.  Eyes:     General: Lids are normal. Vision grossly intact. Gaze aligned appropriately.     Extraocular Movements: Extraocular movements intact.     Conjunctiva/sclera: Conjunctivae normal.  Pulmonary:     Effort: Pulmonary effort is normal.  Musculoskeletal:     Cervical back: Neck supple.  Skin:    General: Skin is warm and dry.     Capillary Refill: Capillary refill takes less than 2 seconds.     Findings: No rash.  Neurological:     General: No focal deficit present.     Mental Status: He is alert and oriented to person, place, and time. Mental status is at baseline.     Cranial Nerves: No dysarthria or facial asymmetry.  Psychiatric:        Mood and Affect: Mood normal.        Speech: Speech normal.        Behavior: Behavior normal.        Thought Content: Thought content normal.        Judgment: Judgment normal.      UC Treatments / Results  Labs (all labs ordered are listed, but only abnormal results are displayed) Labs Reviewed  HIV ANTIBODY (ROUTINE TESTING W REFLEX)  SYPHILIS: RPR W/REFLEX TO RPR TITER AND TREPONEMAL ANTIBODIES, TRADITIONAL SCREENING AND DIAGNOSIS ALGORITHM   CYTOLOGY, (ORAL, ANAL, URETHRAL) ANCILLARY ONLY  CYTOLOGY, (ORAL, ANAL, URETHRAL) ANCILLARY ONLY    EKG   Radiology No results found.  Procedures Procedures (including critical care time)  Medications Ordered in UC Medications - No data to display  Initial Impression / Assessment and Plan / UC Course  I have reviewed the triage vital signs and the nursing notes.  Pertinent labs & imaging results that were available during my care of the patient were reviewed by me and considered in my medical decision making (see chart for details).   1.  Screening for STD, exposure to chlamydia STI labs pending, will notify patient of positive results and treat accordingly per protocol when labs result.  Patient would like HIV and syphilis testing today.   Patient to avoid sexual intercourse until screening testing comes back.   Education provided regarding safe sexual practices and patient encouraged to use protection to prevent spread of STIs.    Counseled patient on potential for adverse effects with medications prescribed/recommended today, strict ER and return-to-clinic precautions discussed, patient verbalized understanding.    Final Clinical Impressions(s) / UC Diagnoses   Final diagnoses:  Screening for STD (sexually transmitted disease)  Exposure to chlamydia     Discharge Instructions      STD testing pending, this will take 2-3 days to result. We will only call you if your testing is positive for any infection(s) and we will provide treatment.  Avoid sexual intercourse until your STD results come back.  If any of your STD results are positive, you will need to avoid sexual intercourse for 7 days while you are being treated to prevent spread of STD.  Condom use is the best way to prevent spread of STDs. Notify partner(s) of any positive results.  Return to urgent care as needed.       ED Prescriptions   None    PDMP not reviewed this encounter.     [1]   Social History Tobacco Use   Smoking status: Every Day    Types: Cigars    Passive exposure: Current   Smokeless tobacco: Never  Vaping Use   Vaping  status: Former  Substance Use Topics   Alcohol use: Yes    Comment: occasionally   Drug use: Yes    Types: Marijuana    Comment: QD     Enedelia Dorna HERO, FNP 08/19/24 1403  "

## 2024-08-19 NOTE — Telephone Encounter (Signed)
 Pt was called for triage and to be roomed. Pt not in lobby and did not answer phone call. Unable to leave voicemail. No further action required at this time.

## 2024-08-20 ENCOUNTER — Ambulatory Visit: Payer: Self-pay

## 2024-08-20 LAB — CYTOLOGY, (ORAL, ANAL, URETHRAL) ANCILLARY ONLY
Chlamydia: NEGATIVE
Chlamydia: NEGATIVE
Comment: NEGATIVE
Comment: NEGATIVE
Comment: NORMAL
Comment: NEGATIVE
Comment: NORMAL
Neisseria Gonorrhea: NEGATIVE
Neisseria Gonorrhea: NEGATIVE
Trichomonas: NEGATIVE

## 2024-08-20 LAB — HIV ANTIBODY (ROUTINE TESTING W REFLEX): HIV Screen 4th Generation wRfx: NONREACTIVE

## 2024-08-20 LAB — SYPHILIS: RPR W/REFLEX TO RPR TITER AND TREPONEMAL ANTIBODIES, TRADITIONAL SCREENING AND DIAGNOSIS ALGORITHM: RPR Ser Ql: NONREACTIVE

## 2024-08-27 ENCOUNTER — Other Ambulatory Visit: Payer: Self-pay

## 2024-08-27 ENCOUNTER — Emergency Department (HOSPITAL_COMMUNITY): Payer: Self-pay

## 2024-08-27 ENCOUNTER — Emergency Department (HOSPITAL_COMMUNITY)
Admission: EM | Admit: 2024-08-27 | Discharge: 2024-08-27 | Disposition: A | Discharge status: Home / Self Care | Payer: Self-pay | Attending: Emergency Medicine | Admitting: Emergency Medicine

## 2024-08-27 DIAGNOSIS — M25511 Pain in right shoulder: Secondary | ICD-10-CM | POA: Insufficient documentation

## 2024-08-27 DIAGNOSIS — R001 Bradycardia, unspecified: Secondary | ICD-10-CM | POA: Insufficient documentation

## 2024-08-27 DIAGNOSIS — R569 Unspecified convulsions: Secondary | ICD-10-CM | POA: Insufficient documentation

## 2024-08-27 LAB — CBC
HCT: 47.5 % (ref 39.0–52.0)
Hemoglobin: 15.9 g/dL (ref 13.0–17.0)
MCH: 32.7 pg (ref 26.0–34.0)
MCHC: 33.5 g/dL (ref 30.0–36.0)
MCV: 97.7 fL (ref 80.0–100.0)
Platelets: 228 K/uL (ref 150–400)
RBC: 4.86 MIL/uL (ref 4.22–5.81)
RDW: 13.3 % (ref 11.5–15.5)
WBC: 5.9 K/uL (ref 4.0–10.5)
nRBC: 0 % (ref 0.0–0.2)

## 2024-08-27 LAB — COMPREHENSIVE METABOLIC PANEL WITH GFR
ALT: 31 U/L (ref 0–44)
AST: 55 U/L — ABNORMAL HIGH (ref 15–41)
Albumin: 4.7 g/dL (ref 3.5–5.0)
Alkaline Phosphatase: 82 U/L (ref 38–126)
Anion gap: 18 — ABNORMAL HIGH (ref 5–15)
BUN: 11 mg/dL (ref 6–20)
CO2: 21 mmol/L — ABNORMAL LOW (ref 22–32)
Calcium: 9.6 mg/dL (ref 8.9–10.3)
Chloride: 103 mmol/L (ref 98–111)
Creatinine, Ser: 1.29 mg/dL — ABNORMAL HIGH (ref 0.61–1.24)
GFR, Estimated: >60 mL/min
Glucose, Bld: 91 mg/dL (ref 70–99)
Potassium: 4.1 mmol/L (ref 3.5–5.1)
Sodium: 143 mmol/L (ref 135–145)
Total Bilirubin: 0.4 mg/dL (ref 0.0–1.2)
Total Protein: 7.9 g/dL (ref 6.5–8.1)

## 2024-08-27 LAB — CBG MONITORING, ED: Glucose-Capillary: 93 mg/dL (ref 70–99)

## 2024-08-27 MED ORDER — LEVETIRACETAM (KEPPRA) 500 MG/5 ML ADULT IV PUSH
1000.0000 mg | Freq: Once | INTRAVENOUS | Status: AC
  Administered 2024-08-27: 1000 mg via INTRAVENOUS
  Filled 2024-08-27: qty 10

## 2024-08-27 MED ORDER — SODIUM CHLORIDE 0.9 % IV BOLUS
1000.0000 mL | Freq: Once | INTRAVENOUS | Status: AC
  Administered 2024-08-27: 1000 mL via INTRAVENOUS

## 2024-08-27 MED ORDER — LEVETIRACETAM 500 MG PO TABS: 500.0000 mg | ORAL_TABLET | Freq: Two times a day (BID) | ORAL | 1 refills | Status: DC

## 2024-08-27 NOTE — Discharge Instructions (Addendum)
 You are being prescribed Keppra , which is a medicine to help prevent seizures.  Take this as prescribed and follow-up with the neurologist.  Return to the ER for any new or worsening symptoms or recurrent seizures.  - According to [place] law, you can not drive unless you are seizure / syncope free for at least 6 months and under physician's care.    - Please maintain precautions. Do not participate in activities where a loss of awareness could harm you or someone else. No swimming alone, no tub bathing, no hot tubs, no driving, no operating motorized vehicles (cars, ATVs, motocycles, etc), lawnmowers, power tools or firearms. No standing at heights, such as rooftops, ladders or stairs. Avoid hot objects such as stoves, heaters, open fires. Wear a helmet when riding a bicycle, scooter, skateboard, etc. and avoid areas of traffic. Set your water heater to 120 degrees or less.

## 2024-08-27 NOTE — ED Provider Notes (Signed)
 " [facility] Provider Note   CSN: [id number] Arrival date & time: 08/27/24  1357     Patient presents with: Seizures   Mason Brown is a 29 y.o. male.   HPI 29 year old male presents with a seizure.  Patient was washing dishes and had an abnormal feeling like he was lightheaded but not quite and he was trying to warn his dad that he thought he might have a seizure when he then went unresponsive.  He had a seizure witnessed by family (EMS reported he actually had 2 seizures).  Patient had a first-time seizure in November but never followed up with neurology, stating he was busy and forgot.  He denies any recent illness, headache, etc.  He denies any head injury today or neck pain.  He does have some moderate right shoulder pain anteriorly.  He also thinks he bit his tongue.  Prior to Admission medications  Medication Sig Start Date End Date Taking? Authorizing Provider  levETIRAcetam  (KEPPRA ) 500 MG tablet Take 1 tablet (500 mg total) by mouth 2 (two) times daily. 08/27/24  Yes Freddi Hamilton, MD  fluticasone  (FLONASE ) 50 MCG/ACT nasal spray Place 2 sprays into both nostrils daily. 12/30/22   Arloa Suzen RAMAN, NP  triamcinolone  (KENALOG ) 0.025 % cream Apply 1 Application topically 2 (two) times daily as needed. 12/30/22   Arloa Suzen RAMAN, NP    Allergies: Patient has no known allergies.    Review of Systems  Gastrointestinal:  Negative for diarrhea and vomiting.  Musculoskeletal:  Positive for arthralgias.  Neurological:  Positive for seizures. Negative for headaches.    Updated Vital Signs BP 123/77 (BP Location: Right Arm)   Pulse 61   Temp (!) 97.5 F (36.4 C) (Oral)   Resp 16   Ht 5' 7 (1.702 m)   Wt 70 kg   SpO2 100%   BMI 24.17 kg/m   Physical Exam Vitals and nursing note reviewed.  Constitutional:      Appearance: He is well-developed.  HENT:     Head: Normocephalic.      Mouth/Throat:   Eyes:      Extraocular Movements: Extraocular movements intact.     Pupils: Pupils are equal, round, and reactive to light.  Cardiovascular:     Rate and Rhythm: Regular rhythm. Bradycardia present.     Pulses:          Radial pulses are 2+ on the right side.     Heart sounds: Normal heart sounds.  Pulmonary:     Effort: Pulmonary effort is normal.     Breath sounds: Normal breath sounds.  Abdominal:     Palpations: Abdomen is soft.     Tenderness: There is no abdominal tenderness.  Musculoskeletal:     Right shoulder: Tenderness present. No swelling or deformity. Normal range of motion.     Cervical back: No rigidity.  Skin:    General: Skin is warm and dry.  Neurological:     Mental Status: He is alert.     Comments: CN 3-12 grossly intact. 5/5 strength in all 4 extremities. Grossly normal sensation. Normal finger to nose.      (all labs ordered are listed, but only abnormal results are displayed) Labs Reviewed  COMPREHENSIVE METABOLIC PANEL WITH GFR - Abnormal; Notable for the following components:      Result Value   CO2 21 (*)    Creatinine, Ser 1.29 (*)    AST 55 (*)  Anion gap 18 (*)    All other components within normal limits  CBC  CBG MONITORING, ED    EKG: None  Radiology: DG Shoulder Right Result Date: 08/27/2024 CLINICAL DATA:  Fall and trauma to the right shoulder. EXAM: RIGHT SHOULDER - 2+ VIEW COMPARISON:  None Available. FINDINGS: There is no evidence of fracture or dislocation. There is no evidence of arthropathy or other focal bone abnormality. Soft tissues are unremarkable. IMPRESSION: Negative. Electronically Signed   By: Vanetta Chou M.D.   On: 08/27/2024 15:54     Procedures   Medications Ordered in the ED  sodium chloride  0.9 % bolus 1,000 mL (0 mLs Intravenous Stopped 08/27/24 1638)  levETIRAcetam  (KEPPRA ) undiluted injection 1,000 mg (1,000 mg Intravenous Given 08/27/24 1545)                                    Medical Decision Making Amount  and/or Complexity of Data Reviewed External Data Reviewed: notes. Labs:     Details: Normal WBC, mild anion gap acidosis, likely from seizure Radiology: ordered and independent interpretation performed.    Details: No shoulder fracture or dislocation ECG/medicine tests: ordered and independent interpretation performed.    Details: Sinus rhythm, no ischemia  Risk Prescription drug management.   Patient presents with a seizure.  Some think he might of had 1 or 2 seizures today.  Either way, this represents a recurrent seizure for him and he will need to start antiepileptic therapy.  Seems like he was appropriately not given any antiseizure meds in his November visit for a first-time seizure.  No head injury today or new headache, I do not think repeat CNS imaging needed emergently.  He did injure his shoulder though seems to be a sprain.  His x-ray is unremarkable.  He is neurovascularly intact.  Will start Keppra  and have him follow-up with neurology.  Stressed the importance of following up with neuro and will give repeat referral.  Discussed he is not allowed to drive for 6 months.  Will discharge with return precautions.     Final diagnoses:  Seizure [facility])    ED Discharge Orders          Ordered    levETIRAcetam  (KEPPRA ) 500 MG tablet  2 times daily        08/27/24 1626    Ambulatory referral to Neurology       Comments: An appointment is requested in approximately: 2 weeks   08/27/24 1626               Freddi Hamilton, MD 08/27/24 1644  "

## 2024-08-27 NOTE — ED Triage Notes (Signed)
 Patient arrives via [facility] for witnessed seizures by family. Patient was doing dishes when he fell to ground, tonic clonic x2 each about 30 seconds approximately 1 minute apart. Patient non compliant with meds, first seizure in November but hasn't followed up with neurology. Alert oriented x4.   Ems vitals BP 140/ palp HR 90 97 on room air CBG 100  20 LFA

## 2024-09-02 ENCOUNTER — Ambulatory Visit: Payer: Self-pay | Admitting: Neurology

## 2024-09-05 ENCOUNTER — Encounter: Payer: Self-pay | Admitting: Diagnostic Neuroimaging

## 2024-09-05 ENCOUNTER — Ambulatory Visit (INDEPENDENT_AMBULATORY_CARE_PROVIDER_SITE_OTHER): Payer: Self-pay | Admitting: Diagnostic Neuroimaging

## 2024-09-05 VITALS — BP 107/64 | HR 60 | Ht 67.5 in | Wt 150.6 lb

## 2024-09-05 DIAGNOSIS — G40909 Epilepsy, unspecified, not intractable, without status epilepticus: Secondary | ICD-10-CM

## 2024-09-05 MED ORDER — LEVETIRACETAM 500 MG PO TABS: 500.0000 mg | ORAL_TABLET | Freq: Two times a day (BID) | ORAL | 12 refills | Status: AC

## 2024-09-05 NOTE — Progress Notes (Signed)
 "  [facility]  PATIENT: Mason Brown DOB: 11-15-1995  REFERRING CLINICIAN: Freddi Hamilton, MD HISTORY FROM: patient  REASON FOR VISIT: new consult   HISTORICAL  CHIEF COMPLAINT:  Chief Complaint  Patient presents with   Establish Care    Patient in room 6 with mother. Patient is here for seizures, states he's only the two seizures. The last seizure was last week, patients dad told him that it lasted for 5-10 minutes.     HISTORY OF PRESENT ILLNESS:   29 year old male here for evaluation of seizures.  06/23/2024 patient was at home, in the kitchen, when he felt a sense of impending doom.  He then collapsed, had generalized convulsions.  He had 2 back-to-back events.  He had tongue biting.  This was witnessed by patient's father.  Went to the emergency room for evaluation.  He was discharged home with recommendation to follow-up with neurology.  08/27/2024 patient again was at home, in the kitchen, when he had a bad feeling and called out to his father that he felt like something was going to happen.  Patient then progressed into 2 generalized seizures each lasting 30 seconds, back-to-back.  He had tongue biting again.  No incontinence.  He was taken to the hospital for evaluation.  He was started on levetiracetam  500 mg twice a day.  Since that time no further seizures.  He feels some side effects from levetiracetam , and therefore has only been taking it once or twice a day sporadically.  No prior events before 2025.  No family history of seizure.  Has some irregular sleep patterns.  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    REVIEW OF SYSTEMS: Full 14 system review of systems performed and negative with exception of: as per HPI.  ALLERGIES: Allergies[1]  HOME MEDICATIONS: Outpatient Medications Prior to Visit  Medication Sig Dispense Refill   fluticasone  (FLONASE ) 50 MCG/ACT nasal spray Place 2 sprays into both nostrils daily.  11.1 mL 0   triamcinolone  (KENALOG ) 0.025 % cream Apply 1 Application topically 2 (two) times daily as needed. 454 g 0   levETIRAcetam  (KEPPRA ) 500 MG tablet Take 1 tablet (500 mg total) by mouth 2 (two) times daily. 60 tablet 1   No facility-administered medications prior to visit.    PAST MEDICAL HISTORY: History reviewed. No pertinent past medical history.  PAST SURGICAL HISTORY: History reviewed. No pertinent surgical history.  FAMILY HISTORY: Family History  Problem Relation Age of Onset   Healthy Mother    Healthy Father     SOCIAL HISTORY: Social History   Socioeconomic History   Marital status: Single    Spouse name: Not on file   Number of children: Not on file   Years of education: Not on file   Highest education level: Not on file  Occupational History   Not on file  Tobacco Use   Smoking status: Every Day    Types: Cigars    Passive exposure: Current   Smokeless tobacco: Never  Vaping Use   Vaping status: Former  Substance and Sexual Activity   Alcohol use: Yes    Comment: occasionally   Drug use: Yes    Types: Marijuana    Comment: QD   Sexual activity: Yes    Birth control/protection: None  Other Topics Concern   Not on file  Social History Narrative   Patient lives with parents,    Patient is currently employed.  Social Drivers of Health   Tobacco Use: High Risk (09/05/2024)   Patient History    Smoking Tobacco Use: Every Day    Smokeless Tobacco Use: Never    Passive Exposure: Current  Financial Resource Strain: Not on file  Food Insecurity: Not on file  Transportation Needs: Not on file  Physical Activity: Not on file  Stress: Not on file  Social Connections: Not on file  Intimate Partner Violence: Not on file  Depression (EYV7-0): Not on file  Alcohol Screen: Not on file  Housing: Not on file  Utilities: Not on file  Health Literacy: Not on file     PHYSICAL EXAM  GENERAL EXAM/CONSTITUTIONAL: Vitals:  Vitals:   09/05/24  1050  BP: 107/64  Pulse: 60  Weight: 150 lb 9.6 oz (68.3 kg)  Height: 5' 7.5 (1.715 m)   Body mass index is 23.24 kg/m. Wt Readings from Last 3 Encounters:  09/05/24 150 lb 9.6 oz (68.3 kg)  08/27/24 154 lb 5.2 oz (70 kg)  08/19/24 154 lb 1.6 oz (69.9 kg)   Patient is in no distress; well developed, nourished and groomed; neck is supple  CARDIOVASCULAR: Examination of carotid arteries is normal; no carotid bruits Regular rate and rhythm, no murmurs Examination of peripheral vascular system by observation and palpation is normal  EYES: Ophthalmoscopic exam of optic discs and posterior segments is normal; no papilledema or hemorrhages No results found.  MUSCULOSKELETAL: Gait, strength, tone, movements noted in Neurologic exam below  NEUROLOGIC: MENTAL STATUS:      No data to display         awake, alert, oriented to person, place and time recent and remote memory intact normal attention and concentration language fluent, comprehension intact, naming intact fund of knowledge appropriate  CRANIAL NERVE:  2nd - no papilledema on fundoscopic exam 2nd, 3rd, 4th, 6th - pupils equal and reactive to light, visual fields full to confrontation, extraocular muscles intact, no nystagmus 5th - facial sensation symmetric 7th - facial strength symmetric 8th - hearing intact 9th - palate elevates symmetrically, uvula midline 11th - shoulder shrug symmetric 12th - tongue protrusion midline  MOTOR:  normal bulk and tone, full strength in the BUE, BLE  SENSORY:  normal and symmetric to light touch, pinprick, temperature, vibration  COORDINATION:  finger-nose-finger, fine finger movements normal  REFLEXES:  deep tendon reflexes present and symmetric  GAIT/STATION:  narrow based gait; able to walk on toes, heels and tandem; romberg is negative     DIAGNOSTIC DATA (LABS, IMAGING, TESTING) - I reviewed patient records, labs, notes, testing and imaging myself where  available.  Lab Results  Component Value Date   WBC 5.9 08/27/2024   HGB 15.9 08/27/2024   HCT 47.5 08/27/2024   MCV 97.7 08/27/2024   PLT 228 08/27/2024      Component Value Date/Time   NA 143 08/27/2024 1419   K 4.1 08/27/2024 1419   CL 103 08/27/2024 1419   CO2 21 (L) 08/27/2024 1419   GLUCOSE 91 08/27/2024 1419   BUN 11 08/27/2024 1419   CREATININE 1.29 (H) 08/27/2024 1419   CALCIUM 9.6 08/27/2024 1419   PROT 7.9 08/27/2024 1419   ALBUMIN 4.7 08/27/2024 1419   AST 55 (H) 08/27/2024 1419   ALT 31 08/27/2024 1419   ALKPHOS 82 08/27/2024 1419   BILITOT 0.4 08/27/2024 1419   GFRNONAA >60 08/27/2024 1419   GFRAA NOT CALCULATED 05/13/2013 1950   No results found for: CHOL, HDL, LDLCALC, LDLDIRECT, TRIG, CHOLHDL  No results found for: HGBA1C No results found for: VITAMINB12 No results found for: TSH   06/23/24 CT head [I reviewed images myself and agree with interpretation. -VRP]  - No acute intracranial abnormality.    ASSESSMENT AND PLAN  29 y.o. year old male here with:  Dx:  1. Seizure disorder [facility])     PLAN:  Seizure disorder (seizures on Jun 23, 2024 x 2; Aug 27, 2024 x 2)  - possible partial onset with secondary generalization  - plan for MRI brain, EEG (will hold off until patient gets insurance again)  - continue levetiracetam  500mg  twice a day (monitor for side effects; could consider lamotrigine, lacosamide, carbamazepine in future)  - According to [place] law, you can not drive unless you are seizure / syncope free for at least 6 months and under physician's care.   - Please maintain precautions. Do not participate in activities where a loss of awareness could harm you or someone else. No swimming alone, no tub bathing, no hot tubs, no driving, no operating motorized vehicles (cars, ATVs, motocycles, etc), lawnmowers, power tools or firearms. No standing at heights, such as rooftops, ladders or stairs. Avoid hot objects such as stoves,  heaters, open fires. Wear a helmet when riding a bicycle, scooter, skateboard, etc. and avoid areas of traffic. Set your water heater to 120 degrees or less.   Meds ordered this encounter  Medications   levETIRAcetam  (KEPPRA ) 500 MG tablet    Sig: Take 1 tablet (500 mg total) by mouth 2 (two) times daily.    Dispense:  60 tablet    Refill:  12   Return in about 4 months (around 01/03/2025) for MyChart visit (15 min).  I reviewed images, labs, notes, records myself. I summarized findings and reviewed with patient, for this high risk condition (seizure) requiring high complexity decision making.     Mason FABIENE HANLON, MD 09/05/2024, 11:34 AM Certified in Neurology, Neurophysiology and Neuroimaging  [facility] [address], [address] [place], [place] [address] [phone]     [1] No Known Allergies  "

## 2024-09-05 NOTE — Patient Instructions (Signed)
" °  Seizure disorder (Jun 23, 2024 x 2; Aug 27, 2024 x 2)  - possible partial onset with secondary generalization  - plan for MRI brain, EEG (will hold off until patient gets insurance again)  - continue levetiracetam  500mg  twice a day (monitor for side effects; could consider lamotrigine, lacosamide, carbamazepine in future)  - According to [place] law, you can not drive unless you are seizure / syncope free for at least 6 months and under physician's care.   - Please maintain precautions. Do not participate in activities where a loss of awareness could harm you or someone else. No swimming alone, no tub bathing, no hot tubs, no driving, no operating motorized vehicles (cars, ATVs, motocycles, etc), lawnmowers, power tools or firearms. No standing at heights, such as rooftops, ladders or stairs. Avoid hot objects such as stoves, heaters, open fires. Wear a helmet when riding a bicycle, scooter, skateboard, etc. and avoid areas of traffic. Set your water heater to 120 degrees or less.  "

## 2025-01-12 ENCOUNTER — Telehealth: Payer: Self-pay | Admitting: Diagnostic Neuroimaging
